# Patient Record
Sex: Male | Born: 1955
Health system: Southern US, Community
[De-identification: ages and names within clinical notes are randomized; demographics above are authoritative.]

## PROBLEM LIST (undated history)

## (undated) ENCOUNTER — Ambulatory Visit: Admission: EM | Disposition: A | Discharge status: Home / Self Care | Payer: BC Managed Care – PPO

## (undated) DIAGNOSIS — K56609 Unspecified intestinal obstruction, unspecified as to partial versus complete obstruction: Secondary | ICD-10-CM

## (undated) DIAGNOSIS — C801 Malignant (primary) neoplasm, unspecified: Secondary | ICD-10-CM

---

## 1980-02-14 DIAGNOSIS — C801 Malignant (primary) neoplasm, unspecified: Secondary | ICD-10-CM

## 1980-02-14 HISTORY — PX: NEPHRECTOMY: SHX65

## 1980-02-14 HISTORY — DX: Malignant (primary) neoplasm, unspecified: C80.1

## 1980-02-14 HISTORY — PX: SURGERY SCROTAL / TESTICULAR: SUR1316

## 2000-10-18 ENCOUNTER — Encounter: Payer: Self-pay | Admitting: *Deleted

## 2000-10-18 ENCOUNTER — Emergency Department (HOSPITAL_COMMUNITY): Admission: EM | Admit: 2000-10-18 | Discharge: 2000-10-18 | Payer: Self-pay | Admitting: Emergency Medicine

## 2004-09-28 ENCOUNTER — Encounter: Admission: RE | Admit: 2004-09-28 | Discharge: 2004-09-28 | Payer: Self-pay | Admitting: Family Medicine

## 2004-09-28 ENCOUNTER — Ambulatory Visit: Payer: Self-pay | Admitting: Family Medicine

## 2004-12-27 ENCOUNTER — Ambulatory Visit: Payer: Self-pay | Admitting: Family Medicine

## 2005-10-25 LAB — HM COLONOSCOPY: HM Colonoscopy: NORMAL

## 2006-01-30 ENCOUNTER — Ambulatory Visit: Payer: Self-pay | Admitting: Family Medicine

## 2006-04-24 ENCOUNTER — Ambulatory Visit: Payer: Self-pay | Admitting: Family Medicine

## 2006-04-24 LAB — CONVERTED CEMR LAB
ALT: 45 units/L — ABNORMAL HIGH (ref 0–40)
AST: 34 units/L (ref 0–37)
Albumin: 4 g/dL (ref 3.5–5.2)
Alkaline Phosphatase: 55 units/L (ref 39–117)
BUN: 20 mg/dL (ref 6–23)
Basophils Absolute: 0 10*3/uL (ref 0.0–0.1)
Basophils Relative: 0.1 % (ref 0.0–1.0)
Bilirubin, Direct: 0.2 mg/dL (ref 0.0–0.3)
CO2: 33 meq/L — ABNORMAL HIGH (ref 19–32)
Calcium: 9.3 mg/dL (ref 8.4–10.5)
Chloride: 104 meq/L (ref 96–112)
Cholesterol: 141 mg/dL (ref 0–200)
Creatinine, Ser: 1 mg/dL (ref 0.4–1.5)
Eosinophils Absolute: 0.1 10*3/uL (ref 0.0–0.6)
Eosinophils Relative: 2.3 % (ref 0.0–5.0)
GFR calc Af Amer: 102 mL/min
GFR calc non Af Amer: 84 mL/min
Glucose, Bld: 80 mg/dL (ref 70–99)
HCT: 45 % (ref 39.0–52.0)
HDL: 38.4 mg/dL — ABNORMAL LOW (ref >39.0)
Hemoglobin: 15.9 g/dL (ref 13.0–17.0)
LDL Cholesterol: 85 mg/dL (ref 0–99)
Lymphocytes Relative: 29.8 % (ref 12.0–46.0)
MCHC: 35.3 g/dL (ref 30.0–36.0)
MCV: 91.4 fL (ref 78.0–100.0)
Monocytes Absolute: 0.5 10*3/uL (ref 0.2–0.7)
Monocytes Relative: 11.6 % — ABNORMAL HIGH (ref 3.0–11.0)
Neutro Abs: 2.3 10*3/uL (ref 1.4–7.7)
Neutrophils Relative %: 56.2 % (ref 43.0–77.0)
PSA: 0.21 ng/mL (ref 0.10–4.00)
Platelets: 209 10*3/uL (ref 150–400)
Potassium: 3.5 meq/L (ref 3.5–5.1)
RBC: 4.92 M/uL (ref 4.22–5.81)
RDW: 11.6 % (ref 11.5–14.6)
Sodium: 147 meq/L — ABNORMAL HIGH (ref 135–145)
TSH: 0.89 microintl units/mL (ref 0.35–5.50)
Total Bilirubin: 1 mg/dL (ref 0.3–1.2)
Total CHOL/HDL Ratio: 3.7
Total Protein: 6.5 g/dL (ref 6.0–8.3)
Triglycerides: 89 mg/dL (ref 0–149)
VLDL: 18 mg/dL (ref 0–40)
WBC: 4.1 10*3/uL — ABNORMAL LOW (ref 4.5–10.5)

## 2006-05-03 ENCOUNTER — Ambulatory Visit: Payer: Self-pay | Admitting: Family Medicine

## 2006-05-16 ENCOUNTER — Ambulatory Visit: Payer: Self-pay | Admitting: Gastroenterology

## 2006-05-30 ENCOUNTER — Encounter (INDEPENDENT_AMBULATORY_CARE_PROVIDER_SITE_OTHER): Payer: Self-pay | Admitting: Specialist

## 2006-05-30 ENCOUNTER — Ambulatory Visit: Payer: Self-pay | Admitting: Gastroenterology

## 2006-09-11 ENCOUNTER — Encounter: Payer: Self-pay | Admitting: Family Medicine

## 2006-09-12 ENCOUNTER — Ambulatory Visit: Payer: Self-pay | Admitting: Family Medicine

## 2006-09-12 DIAGNOSIS — M542 Cervicalgia: Secondary | ICD-10-CM | POA: Insufficient documentation

## 2006-09-12 DIAGNOSIS — R300 Dysuria: Secondary | ICD-10-CM | POA: Insufficient documentation

## 2006-09-12 LAB — CONVERTED CEMR LAB
Bilirubin Urine: NEGATIVE
Blood in Urine, dipstick: NEGATIVE
Glucose, Urine, Semiquant: NEGATIVE
Ketones, urine, test strip: NEGATIVE
Nitrite: NEGATIVE
Protein, U semiquant: NEGATIVE
Specific Gravity, Urine: 1.02
Urobilinogen, UA: 0.2
WBC Urine, dipstick: NEGATIVE
pH: 5

## 2006-12-20 ENCOUNTER — Ambulatory Visit: Payer: Self-pay | Admitting: Family Medicine

## 2006-12-20 DIAGNOSIS — E785 Hyperlipidemia, unspecified: Secondary | ICD-10-CM | POA: Insufficient documentation

## 2006-12-20 DIAGNOSIS — M79609 Pain in unspecified limb: Secondary | ICD-10-CM | POA: Insufficient documentation

## 2006-12-20 DIAGNOSIS — Z8547 Personal history of malignant neoplasm of testis: Secondary | ICD-10-CM | POA: Insufficient documentation

## 2006-12-20 LAB — CONVERTED CEMR LAB
Bilirubin Urine: NEGATIVE
Blood in Urine, dipstick: NEGATIVE
Glucose, Urine, Semiquant: NEGATIVE
Ketones, urine, test strip: NEGATIVE
Nitrite: NEGATIVE
Protein, U semiquant: NEGATIVE
Specific Gravity, Urine: 1.015
Urobilinogen, UA: 0.2
WBC Urine, dipstick: NEGATIVE
pH: 5.5

## 2007-01-21 ENCOUNTER — Telehealth: Payer: Self-pay | Admitting: Family Medicine

## 2007-10-09 ENCOUNTER — Ambulatory Visit: Payer: Self-pay | Admitting: Family Medicine

## 2007-10-09 DIAGNOSIS — K625 Hemorrhage of anus and rectum: Secondary | ICD-10-CM | POA: Insufficient documentation

## 2007-10-11 LAB — CONVERTED CEMR LAB
ALT: 41 units/L (ref 0–53)
AST: 27 units/L (ref 0–37)
Albumin: 4.2 g/dL (ref 3.5–5.2)
Alkaline Phosphatase: 61 units/L (ref 39–117)
BUN: 18 mg/dL (ref 6–23)
Basophils Absolute: 0 10*3/uL (ref 0.0–0.1)
Basophils Relative: 0.3 % (ref 0.0–3.0)
Bilirubin, Direct: 0.1 mg/dL (ref 0.0–0.3)
CO2: 29 meq/L (ref 19–32)
Calcium: 9.5 mg/dL (ref 8.4–10.5)
Chloride: 110 meq/L (ref 96–112)
Cholesterol: 254 mg/dL (ref 0–200)
Creatinine, Ser: 1 mg/dL (ref 0.4–1.5)
Direct LDL: 138.2 mg/dL
Eosinophils Absolute: 0.1 10*3/uL (ref 0.0–0.7)
Eosinophils Relative: 1.7 % (ref 0.0–5.0)
GFR calc Af Amer: 101 mL/min
GFR calc non Af Amer: 83 mL/min
Glucose, Bld: 90 mg/dL (ref 70–99)
HCT: 47.9 % (ref 39.0–52.0)
HDL: 43.7 mg/dL (ref >39.0)
Hemoglobin: 17.1 g/dL — ABNORMAL HIGH (ref 13.0–17.0)
Lymphocytes Relative: 26.7 % (ref 12.0–46.0)
MCHC: 35.7 g/dL (ref 30.0–36.0)
MCV: 92.2 fL (ref 78.0–100.0)
Monocytes Absolute: 0.5 10*3/uL (ref 0.1–1.0)
Monocytes Relative: 11.1 % (ref 3.0–12.0)
Neutro Abs: 3 10*3/uL (ref 1.4–7.7)
Neutrophils Relative %: 60.2 % (ref 43.0–77.0)
PSA: 0.2 ng/mL (ref 0.10–4.00)
Platelets: 210 10*3/uL (ref 150–400)
Potassium: 3.6 meq/L (ref 3.5–5.1)
RBC: 5.2 M/uL (ref 4.22–5.81)
RDW: 11.5 % (ref 11.5–14.6)
Sodium: 143 meq/L (ref 135–145)
TSH: 0.64 microintl units/mL (ref 0.35–5.50)
Total Bilirubin: 1 mg/dL (ref 0.3–1.2)
Total CHOL/HDL Ratio: 5.8
Total Protein: 7.4 g/dL (ref 6.0–8.3)
Triglycerides: 118 mg/dL (ref 0–149)
VLDL: 24 mg/dL (ref 0–40)
WBC: 4.9 10*3/uL (ref 4.5–10.5)

## 2007-12-25 ENCOUNTER — Telehealth: Payer: Self-pay | Admitting: Family Medicine

## 2008-09-01 ENCOUNTER — Ambulatory Visit: Payer: Self-pay | Admitting: Family Medicine

## 2008-09-09 ENCOUNTER — Ambulatory Visit: Payer: Self-pay | Admitting: Family Medicine

## 2008-09-16 LAB — CONVERTED CEMR LAB
ALT: 38 units/L (ref 0–53)
AST: 29 units/L (ref 0–37)
Albumin: 4 g/dL (ref 3.5–5.2)
Alkaline Phosphatase: 56 units/L (ref 39–117)
BUN: 21 mg/dL (ref 6–23)
Basophils Absolute: 0 10*3/uL (ref 0.0–0.1)
Basophils Relative: 0.2 % (ref 0.0–3.0)
Bilirubin Urine: NEGATIVE
Bilirubin, Direct: 0.1 mg/dL (ref 0.0–0.3)
Blood in Urine, dipstick: NEGATIVE
CO2: 30 meq/L (ref 19–32)
Calcium: 9.4 mg/dL (ref 8.4–10.5)
Chloride: 109 meq/L (ref 96–112)
Cholesterol: 157 mg/dL (ref 0–200)
Creatinine, Ser: 1 mg/dL (ref 0.4–1.5)
Eosinophils Absolute: 0.1 10*3/uL (ref 0.0–0.7)
Eosinophils Relative: 2 % (ref 0.0–5.0)
GFR calc non Af Amer: 83.04 mL/min (ref >60)
Glucose, Bld: 83 mg/dL (ref 70–99)
Glucose, Urine, Semiquant: NEGATIVE
HCT: 45.1 % (ref 39.0–52.0)
HDL: 41.4 mg/dL (ref >39.00)
Hemoglobin: 15.7 g/dL (ref 13.0–17.0)
LDL Cholesterol: 98 mg/dL (ref 0–99)
Lymphocytes Relative: 32.4 % (ref 12.0–46.0)
Lymphs Abs: 1.5 10*3/uL (ref 0.7–4.0)
MCHC: 34.9 g/dL (ref 30.0–36.0)
MCV: 93.4 fL (ref 78.0–100.0)
Monocytes Absolute: 0.5 10*3/uL (ref 0.1–1.0)
Monocytes Relative: 12 % (ref 3.0–12.0)
Neutro Abs: 2.4 10*3/uL (ref 1.4–7.7)
Neutrophils Relative %: 53.4 % (ref 43.0–77.0)
Nitrite: NEGATIVE
PSA: 0.25 ng/mL (ref 0.10–4.00)
Platelets: 185 10*3/uL (ref 150.0–400.0)
Potassium: 3.7 meq/L (ref 3.5–5.1)
RBC: 4.83 M/uL (ref 4.22–5.81)
RDW: 11.3 % — ABNORMAL LOW (ref 11.5–14.6)
Sodium: 144 meq/L (ref 135–145)
Specific Gravity, Urine: 1.02
TSH: 0.66 microintl units/mL (ref 0.35–5.50)
Total Bilirubin: 1.1 mg/dL (ref 0.3–1.2)
Total CHOL/HDL Ratio: 4
Total Protein: 6.9 g/dL (ref 6.0–8.3)
Triglycerides: 88 mg/dL (ref 0.0–149.0)
Urobilinogen, UA: 1
VLDL: 17.6 mg/dL (ref 0.0–40.0)
WBC Urine, dipstick: NEGATIVE
WBC: 4.5 10*3/uL (ref 4.5–10.5)
pH: 6.5

## 2008-09-17 ENCOUNTER — Telehealth: Payer: Self-pay | Admitting: Family Medicine

## 2009-10-11 ENCOUNTER — Telehealth: Payer: Self-pay | Admitting: Family Medicine

## 2009-10-21 ENCOUNTER — Ambulatory Visit: Payer: Self-pay | Admitting: Family Medicine

## 2009-10-21 LAB — CONVERTED CEMR LAB
Bilirubin Urine: NEGATIVE
Blood in Urine, dipstick: NEGATIVE
Glucose, Urine, Semiquant: NEGATIVE
Ketones, urine, test strip: NEGATIVE
Nitrite: NEGATIVE
Protein, U semiquant: NEGATIVE
Specific Gravity, Urine: <1.005
Urobilinogen, UA: 0.2
WBC Urine, dipstick: NEGATIVE
pH: 5.5

## 2009-10-22 LAB — CONVERTED CEMR LAB
ALT: 39 units/L (ref 0–53)
AST: 29 units/L (ref 0–37)
Albumin: 4.1 g/dL (ref 3.5–5.2)
Alkaline Phosphatase: 66 units/L (ref 39–117)
BUN: 21 mg/dL (ref 6–23)
Basophils Absolute: 0 10*3/uL (ref 0.0–0.1)
Basophils Relative: 0.2 % (ref 0.0–3.0)
Bilirubin, Direct: 0.2 mg/dL (ref 0.0–0.3)
CO2: 29 meq/L (ref 19–32)
Calcium: 9.6 mg/dL (ref 8.4–10.5)
Chloride: 103 meq/L (ref 96–112)
Cholesterol: 190 mg/dL (ref 0–200)
Creatinine, Ser: 1 mg/dL (ref 0.4–1.5)
Eosinophils Absolute: 0.1 10*3/uL (ref 0.0–0.7)
Eosinophils Relative: 1.5 % (ref 0.0–5.0)
GFR calc non Af Amer: 79.91 mL/min (ref >60)
Glucose, Bld: 76 mg/dL (ref 70–99)
HCT: 46 % (ref 39.0–52.0)
HDL: 39.2 mg/dL (ref >39.00)
Hemoglobin: 16.1 g/dL (ref 13.0–17.0)
LDL Cholesterol: 121 mg/dL — ABNORMAL HIGH (ref 0–99)
Lymphocytes Relative: 28.8 % (ref 12.0–46.0)
Lymphs Abs: 1.5 10*3/uL (ref 0.7–4.0)
MCHC: 34.9 g/dL (ref 30.0–36.0)
MCV: 95 fL (ref 78.0–100.0)
Monocytes Absolute: 0.7 10*3/uL (ref 0.1–1.0)
Monocytes Relative: 13.3 % — ABNORMAL HIGH (ref 3.0–12.0)
Neutro Abs: 3 10*3/uL (ref 1.4–7.7)
Neutrophils Relative %: 56.2 % (ref 43.0–77.0)
PSA: 0.19 ng/mL (ref 0.10–4.00)
Platelets: 201 10*3/uL (ref 150.0–400.0)
Potassium: 3.8 meq/L (ref 3.5–5.1)
RBC: 4.84 M/uL (ref 4.22–5.81)
RDW: 12.6 % (ref 11.5–14.6)
Sodium: 140 meq/L (ref 135–145)
TSH: 0.96 microintl units/mL (ref 0.35–5.50)
Total Bilirubin: 1 mg/dL (ref 0.3–1.2)
Total CHOL/HDL Ratio: 5
Total Protein: 6.6 g/dL (ref 6.0–8.3)
Triglycerides: 148 mg/dL (ref 0.0–149.0)
VLDL: 29.6 mg/dL (ref 0.0–40.0)
WBC: 5.3 10*3/uL (ref 4.5–10.5)

## 2009-10-28 ENCOUNTER — Ambulatory Visit: Payer: Self-pay | Admitting: Family Medicine

## 2009-10-28 DIAGNOSIS — J3489 Other specified disorders of nose and nasal sinuses: Secondary | ICD-10-CM | POA: Insufficient documentation

## 2009-10-28 DIAGNOSIS — H6120 Impacted cerumen, unspecified ear: Secondary | ICD-10-CM | POA: Insufficient documentation

## 2009-10-28 DIAGNOSIS — D485 Neoplasm of uncertain behavior of skin: Secondary | ICD-10-CM | POA: Insufficient documentation

## 2009-11-18 ENCOUNTER — Encounter: Payer: Self-pay | Admitting: Family Medicine

## 2010-03-17 NOTE — Consult Note (Signed)
Summary: [facility]  [facility]   Imported By: Maryln Gottron 11/26/2009 10:56:43  _____________________________________________________________________  External Attachment:    Type:   Image     Comment:   External Document

## 2010-03-17 NOTE — Assessment & Plan Note (Signed)
Summary: CPX // RS   Vital Signs:  Patient profile:   55 year old male Height:      73 inches (185.42 cm) Weight:      200.31 pounds (91.05 kg) BMI:     26.52 O2 Sat:      97 % on Room air Temp:     98.2 degrees F (36.78 degrees C) oral Pulse rate:   78 / minute BP sitting:   118 / 78  (left arm) Cuff size:   regular  Vitals Entered By: Josph Macho RMA (October 28, 2009 8:48 AM)  O2 Flow:  Room air CC: Physical/ CF Is Patient Diabetic? No   History of Present Illness: Patient in today for annual exam. Generally doing well but with a few concerns. He has been watching a lesion on his scalp for several months now. He describes it as scaly with an erythematous base. The scales fall off at times and then return. They came off just yesterday. No bleeding and it is not enlarging, denies any trauma as an initiating event. Otherwise he feels well. He does note some occasional pain in his left hip and his shoulders R>L. Throbs at times especially when he lies on it. He is left handed. He denies CP/palp/SOB/f/c/recent illness/GI or GU c/o.  Preventive Screening-Counseling & Management  Alcohol-Tobacco     Smoking Status: never  Current Medications (verified): 1)  Diclofenac Sodium 75 Mg  Tbec (Diclofenac Sodium) .Marland Kitchen.. 1 Two Times A Day After Meals For Arthritis 2)  Simvastatin 40 Mg  Tabs (Simvastatin) .Marland Kitchen.. 1 Tab Hs For Lipid Control 3)  Adult Aspirin Ec Low Strength 81 Mg Tbec (Aspirin) .Marland Kitchen.. 1 Qd  Allergies (verified): No Known Drug Allergies  Past History:  Past medical, surgical, family and social histories (including risk factors) reviewed, and no changes noted (except as noted below).  Past Surgical History: Reviewed history from 09/11/2006 and no changes required. Left Testiculr and Left Kidney Cancer  Family History: Reviewed history and no changes required. Father alive at 19 with atrial fibrillation, h/o cigarette smoking No known FH of skin cancer  Social  History: Reviewed history and no changes required. Married Never Smoked Seat belt use is constant Alcohol use-yes, very occasional use  Review of Systems  The patient denies anorexia, fever, weight loss, weight gain, vision loss, decreased hearing, hoarseness, chest pain, syncope, dyspnea on exertion, peripheral edema, prolonged cough, headaches, hemoptysis, abdominal pain, melena, hematochezia, severe indigestion/heartburn, hematuria, incontinence, genital sores, muscle weakness, suspicious skin lesions, transient blindness, difficulty walking, depression, unusual weight change, abnormal bleeding, enlarged lymph nodes, angioedema, breast masses, and testicular masses.    Physical Exam  General:  Well-developed,well-nourished,in no acute distress; alert,appropriate and cooperative throughout examination Head:  Normocephalic and atraumatic without obvious abnormalities. No apparent alopecia or balding. Eyes:  No corneal or conjunctival inflammation noted. EOMI.  Ears:  Cerumen is occluding both ear canals Nose:  Deviated septum noted with very narrow passage on right side. Mucosa is boggy and mildly erythematous Mouth:  Oral mucosa and oropharynx without lesions or exudates.  Teeth in good repair. Neck:  No deformities, masses, or tenderness noted. Lungs:  Normal respiratory effort, chest expands symmetrically. Lungs are clear to auscultation, no crackles or wheezes. Heart:  Normal rate and regular rhythm. S1 and S2 normal without gallop, murmur, click, rub or other extra sounds. Abdomen:  Bowel sounds positive,abdomen soft and non-tender without masses, organomegaly or hernias noted. Msk:  No deformity or scoliosis noted of thoracic or  lumbar spine.   Pulses:  R and L carotid,radial,femoral,dorsalis pedis and posterior tibial pulses are full and equal bilaterally Extremities:  No clubbing, cyanosis, edema, or deformity noted with normal full range of motion of all joints.   Neurologic:  No  cranial nerve deficits noted. Station and gait are normal. Plantar reflexes are down-going bilaterally. DTRs are symmetrical throughout. Sensory, motor and coordinative functions appear intact. Skin:  Intact without suspicious lesions or rashes, except for a 5-10 mm raised, erythematous, slightly scaly lesion on scalp just above left side of forehead Cervical Nodes:  No lymphadenopathy noted Psych:  Cognition and judgment appear intact. Alert and cooperative with normal attention span and concentration. No apparent delusions, illusions, hallucinations   Impression & Recommendations:  Problem # 1:  HYPERLIPIDEMIA (ICD-272.4)  His updated medication list for this problem includes:    Simvastatin 40 Mg Tabs (Simvastatin) .Marland Kitchen... 1 tab hs for lipid control Encouraged fish oil and fiber supplements, avoid trans fats  Problem # 2:  LESION, SCALP (ICD-238.2)  Referred to dermatology for evaluation of skin lesion and mole mapping  Orders: Dermatology Referral (Derma)  Problem # 3:  OTHER DISEASES OF NASAL CAVITY AND SINUSES (ICD-478.19) Encouraged at bedtime nasal saline flushes and offered a referral to ENT for further eval. Declines at this time  Problem # 4:  NECK PAIN (ICD-723.1)  The following medications were removed from the medication list:    Diclofenac Sodium 75 Mg Tbec (Diclofenac sodium) .Marland Kitchen... 1 two times a day after meals for arthritis His updated medication list for this problem includes:    Adult Aspirin Ec Low Strength 81 Mg Tbec (Aspirin) .Marland Kitchen... 1 qd    Meloxicam 15 Mg Tabs (Meloxicam) .Marland Kitchen... 1 tab by mouth daily as needed pain with food Encouraged daily moist heat and gentle stretching to neck and shoulders  Problem # 5:  RECTAL BLEEDING (ICD-569.3) No episodes recently. Will report concerns  Complete Medication List: 1)  Simvastatin 40 Mg Tabs (Simvastatin) .Marland Kitchen.. 1 tab hs for lipid control 2)  Adult Aspirin Ec Low Strength 81 Mg Tbec (Aspirin) .Marland Kitchen.. 1 qd 3)  Meloxicam 15  Mg Tabs (Meloxicam) .Marland Kitchen.. 1 tab by mouth daily as needed pain with food  Patient Instructions: 1)  Please schedule a follow-up appointment in 1 year.  2)  BMP prior to visit, ICD-9: v70.0 3)  Hepatic Panel prior to visit ICD-9: v70.0 4)  Lipid panel prior to visit ICD-9 : 272.4 5)  TSH prior to visit ICD-9 : v70.0 6)  CBC w/ Diff prior to visit ICD-9 : v70.0 7)  PSA prior to visit ICD-9: v70.0 8)  Flush ears with 1/2 hydrogen peroxide and 1/2 warm water if unable to remove cerumen schedule appt for flushing. 9)  You have been referred to Derm to have scalp lesion evaluated 10)  Add 2 fish oil caps to daily regimen to help cholesterol and joints 11)  Moist heat and gentle stretching to neck and shoulders nightly 12)  Consider a fiber supplment such as Benefiber or Metamucil daily to help bowel health and cholesterol Prescriptions: SIMVASTATIN 40 MG  TABS (SIMVASTATIN) 1 tab hs for lipid control  #90 x 3   Entered and Authorized by:   Danise Edge MD   Signed by:   Danise Edge MD on 10/28/2009   Method used:   Electronically to        [facility] (retail)       [address] [place]  [place], [place]  [address]       Ph: [phone]       Fax: [phone]   RxID:   [id number] MELOXICAM 15 MG TABS (MELOXICAM) 1 tab by mouth daily as needed pain with food  #30 x 2   Entered and Authorized by:   Danise Edge MD   Signed by:   Danise Edge MD on 10/28/2009   Method used:   Electronically to        [facility] (retail)       [address] [place], [place]  [address]       Ph: [phone]       Fax: [phone]   RxID:   [id number]

## 2010-03-17 NOTE — Progress Notes (Signed)
Summary: Pt req  refill of Simvastatin 40mg  genic to [facility]  Phone Note Refill Request Call back at [phone] cell Message from:  Patient  Refills Requested: Medication #1:  SIMVASTATIN 40 MG  TABS 1 tab hs for lipid control   Dosage confirmed as above?Dosage Confirmed   Supply Requested: 3 months Please call in 90 day supply to [facility].       Method Requested: Telephone to Pharmacy Initial call taken by: Lucy Antigua,  October 11, 2009 3:40 PM  Follow-up for Phone Call        OK to refill his Simvastatin 40mg  1 tab by mouth at bedtime #90 with 0 RF, but he has not done labs in nearly 14 months now, needs to repeat FLP and LFT before more 90 day supply refills will be called in Follow-up by: Danise Edge MD,  October 11, 2009 5:06 PM  Additional Follow-up for Phone Call Additional follow up Details #1::        Informed pt Additional Follow-up by: Josph Macho RMA,  October 12, 2009 7:53 AM    Prescriptions: SIMVASTATIN 40 MG  TABS (SIMVASTATIN) 1 tab hs for lipid control  #90 x 0   Entered by:   Josph Macho RMA   Authorized by:   Danise Edge MD   Signed by:   Josph Macho RMA on 10/12/2009   Method used:   Electronically to        [facility] (retail)       [address] [place], [place]  [address]       Ph: [phone]       Fax: [phone]   RxID:   [id number]

## 2011-03-06 ENCOUNTER — Encounter (HOSPITAL_COMMUNITY): Payer: Self-pay | Admitting: Family Medicine

## 2011-03-06 ENCOUNTER — Emergency Department (HOSPITAL_COMMUNITY): Payer: BC Managed Care – PPO

## 2011-03-06 ENCOUNTER — Inpatient Hospital Stay (HOSPITAL_COMMUNITY)
Admission: EM | Admit: 2011-03-06 | Discharge: 2011-03-09 | DRG: 180 | Disposition: A | Discharge status: Home / Self Care | Payer: BC Managed Care – PPO | Attending: Internal Medicine | Admitting: Internal Medicine

## 2011-03-06 DIAGNOSIS — R1115 Cyclical vomiting syndrome unrelated to migraine: Secondary | ICD-10-CM

## 2011-03-06 DIAGNOSIS — Z8547 Personal history of malignant neoplasm of testis: Secondary | ICD-10-CM

## 2011-03-06 DIAGNOSIS — K56609 Unspecified intestinal obstruction, unspecified as to partial versus complete obstruction: Principal | ICD-10-CM | POA: Diagnosis present

## 2011-03-06 DIAGNOSIS — E876 Hypokalemia: Secondary | ICD-10-CM | POA: Diagnosis present

## 2011-03-06 DIAGNOSIS — R112 Nausea with vomiting, unspecified: Secondary | ICD-10-CM | POA: Diagnosis present

## 2011-03-06 HISTORY — DX: Malignant (primary) neoplasm, unspecified: C80.1

## 2011-03-06 HISTORY — DX: Unspecified intestinal obstruction, unspecified as to partial versus complete obstruction: K56.609

## 2011-03-06 LAB — COMPREHENSIVE METABOLIC PANEL
ALT: 23 U/L (ref 0–53)
Albumin: 4.3 g/dL (ref 3.5–5.2)
Alkaline Phosphatase: 74 U/L (ref 39–117)
BUN: 17 mg/dL (ref 6–23)
Chloride: 100 mEq/L (ref 96–112)
Glucose, Bld: 105 mg/dL — ABNORMAL HIGH (ref 70–99)
Potassium: 3.6 mEq/L (ref 3.5–5.1)
Sodium: 139 mEq/L (ref 135–145)
Total Bilirubin: 0.5 mg/dL (ref 0.3–1.2)

## 2011-03-06 LAB — CBC
HCT: 45.4 % (ref 39.0–52.0)
Hemoglobin: 16.3 g/dL (ref 13.0–17.0)
RBC: 5.13 MIL/uL (ref 4.22–5.81)
WBC: 9.1 10*3/uL (ref 4.0–10.5)

## 2011-03-06 LAB — DIFFERENTIAL
Lymphocytes Relative: 10 % — ABNORMAL LOW (ref 12–46)
Monocytes Absolute: 0.7 10*3/uL (ref 0.1–1.0)
Monocytes Relative: 7 % (ref 3–12)
Neutro Abs: 7.5 10*3/uL (ref 1.7–7.7)
Neutrophils Relative %: 83 % — ABNORMAL HIGH (ref 43–77)

## 2011-03-06 MED ORDER — SODIUM CHLORIDE 0.9 % IV SOLN
Freq: Once | INTRAVENOUS | Status: AC
  Administered 2011-03-06: 12:00:00 via INTRAVENOUS

## 2011-03-06 MED ORDER — MORPHINE SULFATE 4 MG/ML IJ SOLN: 8.0000 mg | INTRAMUSCULAR | Status: DC | PRN

## 2011-03-06 MED ORDER — METOCLOPRAMIDE HCL 5 MG/ML IJ SOLN
10.0000 mg | Freq: Once | INTRAMUSCULAR | Status: AC
  Administered 2011-03-06: 10 mg via INTRAVENOUS

## 2011-03-06 MED ORDER — ONDANSETRON HCL 4 MG/2ML IJ SOLN
4.0000 mg | INTRAMUSCULAR | Status: AC | PRN
  Administered 2011-03-06 (×2): 4 mg via INTRAVENOUS
  Filled 2011-03-06 (×2): qty 2

## 2011-03-06 MED ORDER — IOHEXOL 300 MG/ML  SOLN
100.0000 mL | Freq: Once | INTRAMUSCULAR | Status: AC | PRN
  Administered 2011-03-06: 100 mL via INTRAVENOUS

## 2011-03-06 MED ORDER — ONDANSETRON HCL 4 MG/2ML IJ SOLN
4.0000 mg | INTRAMUSCULAR | Status: DC | PRN
  Administered 2011-03-06: 4 mg via INTRAVENOUS
  Filled 2011-03-06: qty 2

## 2011-03-06 MED ORDER — MORPHINE SULFATE 4 MG/ML IJ SOLN
8.0000 mg | INTRAMUSCULAR | Status: AC | PRN
  Administered 2011-03-06 (×2): 8 mg via INTRAVENOUS
  Filled 2011-03-06 (×2): qty 2

## 2011-03-06 NOTE — ED Notes (Signed)
Per EMS: Pt reports having "belly pain" since last night. Denies N/V/D.

## 2011-03-06 NOTE — ED Notes (Signed)
WUJ:[id number]<YN> Expected date:03/06/11<BR> Expected time:10:30 AM<BR> Means of arrival:Ambulance<BR> Comments:<BR> M60. 56 yo m. abd pain. 15 min eta

## 2011-03-06 NOTE — ED Provider Notes (Signed)
History     CSN: [id number]  Arrival date & time 03/06/11  1032   First MD Initiated Contact with Patient 03/06/11 1103      Chief Complaint  Patient presents with  . Abdominal Pain    (Consider location/radiation/quality/duration/timing/severity/associated sxs/prior treatment) HPI Comments: Patient presents with a chief complaint of abdominal pain since last night.  The pain at that time was ranked at a 3/10.  When the patient was woke up this morning his pain was excruciating at 9/10.  He feels as if his pain is gradually worsening and comes in waves intermittently.  His pain currently is 6/10 and located in the ED.Pain located in the periumbilical & epigastric areas.  The patient does have a history of abdominal surgery, testicular cancer that spread to his kidney, nephrectomy and small bowel obstruction.  Patient denies vomiting, diarrhea, constipation, fever, night sweats, chills, change in stool color, or urinary symptoms including frequency, hesitancy, dysuria.  Patient denies back pain.  Patient is a 56 y.o. male presenting with abdominal pain. The history is provided by the patient.  Abdominal Pain The primary symptoms of the illness include abdominal pain and nausea. The primary symptoms of the illness do not include fever, shortness of breath, vomiting, diarrhea or dysuria. The current episode started yesterday. The onset of the illness was gradual. The problem has been gradually worsening.  Symptoms associated with the illness do not include chills, constipation, urgency or hematuria.    Past Medical History  Diagnosis Date  . Cancer     testicular  . Small bowel obstruction     History reviewed. No pertinent past surgical history.  History reviewed. No pertinent family history.  History  Substance Use Topics  . Smoking status: Never Smoker   . Smokeless tobacco: Not on file  . Alcohol Use: No     former use x20 years ago      Review of Systems    Constitutional: Positive for appetite change. Negative for fever and chills.  HENT: Negative for neck stiffness and dental problem.   Eyes: Negative for visual disturbance.  Respiratory: Negative for cough, chest tightness, shortness of breath and wheezing.   Cardiovascular: Negative for chest pain.  Gastrointestinal: Positive for nausea and abdominal pain. Negative for vomiting, diarrhea, constipation, blood in stool, abdominal distention, anal bleeding and rectal pain.  Genitourinary: Negative for dysuria, urgency, hematuria, flank pain, discharge, scrotal swelling, penile pain and testicular pain.  Musculoskeletal: Negative for myalgias and arthralgias.  Skin: Negative for rash.  Neurological: Negative for dizziness, syncope, speech difficulty, numbness and headaches.  Hematological: Does not bruise/bleed easily.  All other systems reviewed and are negative.    Allergies  Review of patient's allergies indicates no known allergies.  Home Medications   Current Outpatient Rx  Name Route Sig Dispense Refill  . BISMUTH SUBSALICYLATE 262 MG/15ML PO SUSP Oral Take 15 mLs by mouth every 6 (six) hours as needed. For stomach pain    . SIMVASTATIN 40 MG PO TABS Oral Take 40 mg by mouth every evening.    Marland Kitchen VITAMIN C 500 MG PO TABS Oral Take 1,000 mg by mouth daily.      BP 128/72  Pulse 69  Temp(Src) 97.8 F (36.6 C) (Oral)  Resp 18  SpO2 94%  Physical Exam  Nursing note and vitals reviewed. Constitutional: Vital signs are normal. He appears well-developed and well-nourished. No distress.  HENT:  Head: Normocephalic and atraumatic.  Mouth/Throat: Uvula is midline, oropharynx is clear and  moist and mucous membranes are normal.  Eyes: Conjunctivae and EOM are normal. Pupils are equal, round, and reactive to light.  Neck: Normal range of motion and full passive range of motion without pain. Neck supple. No spinous process tenderness and no muscular tenderness present. No rigidity. No  Brudzinski's sign noted.  Cardiovascular: Normal rate and regular rhythm.   Pulmonary/Chest: Effort normal and breath sounds normal. No accessory muscle usage. Not tachypneic. No respiratory distress.  Abdominal: Soft. Normal appearance. He exhibits no distension, no ascites, no pulsatile midline mass and no mass. There is tenderness in the epigastric area and periumbilical area. There is no CVA tenderness. No hernia.    Lymphadenopathy:    He has no cervical adenopathy.  Neurological: He is alert.  Skin: Skin is warm and dry. No rash noted. He is not diaphoretic.  Psychiatric: He has a normal mood and affect. His speech is normal and behavior is normal.    ED Course  Procedures (including critical care time)  Labs Reviewed  DIFFERENTIAL - Abnormal; Notable for the following:    Neutrophils Relative 83 (*)    Lymphocytes Relative 10 (*)    All other components within normal limits  COMPREHENSIVE METABOLIC PANEL - Abnormal; Notable for the following:    Glucose, Bld 105 (*)    All other components within normal limits  CBC  LIPASE, BLOOD   Ct Abdomen Pelvis W Contrast  03/06/2011  *RADIOLOGY REPORT*  Clinical Data: Mid abdominal pain, history of small bowel obstruction, testicular cancer with metastatic disease, history of nephrectomy.  CT ABDOMEN AND PELVIS WITH CONTRAST  Technique:  Multidetector CT imaging of the abdomen and pelvis was performed following the standard protocol during bolus administration of intravenous contrast.  Contrast: OMNIPAQUE IOHEXOL 300 MG/ML IV SOLN  Comparison: None.  Findings:  Normal hepatic contour.  No discrete hepatic lesions.  Normal gallbladder.  No intra or extrahepatic biliary duct dilatation.  A small amount of fluid is noted adjacent to the inferior aspect of the right lobe of the liver (image 32, series 2).  Post left-sided nephrectomy.  There is no residual tissue within the left nephrectomy bed.  The remaining right kidney enhances and  excretes normally.  No discrete right-sided renal lesions.  No urinary obstruction.  The bilateral adrenal glands are normal. Normal pancreas and spleen.  Incidental note is made of several small splenules.  The majority of ingested enteric contrast remains within the stomach.  There is mild dilatation of several loops of small bowel within the lower abdomen ( index loop measures 2.8 cm in diameter, image 72, series 2), however there is no definitive upstream and downstream dilatation to suggest obstruction.  Several of these dilated loops of small bowel contain mottled lucencies favored to represent stool which may suggest an element of stasis.  The appendix is not definitely identified, however there is no stranding within the right lower quadrant.  No pneumoperitoneum, pneumatosis or portal venous gas.  Normal caliber abdominal aorta.  The major branch vessels of the abdominal aorta are all patent.  Post retroperitoneal and bilateral pelvic sidewall lymph node dissection without evidence of retroperitoneal, mesenteric, pelvic or inguinal lymphadenopathy.  Limited visualization of the lower thorax demonstrates bibasilar atelectasis.  No focal airspace opacities.  No pleural effusion. Normal heart size.  No pericardial effusion.  No acute or aggressive osseous abnormalities.  Lumbar spine degenerative change.  IMPRESSION: 1.  Mild dilatation of several loops of small bowel within the mid/lower pelvis without  a discrete transition point to suggest obstruction.  Several of these loops of small bowel contain feces which may suggest a component of enteric stasis.  2.  Post left-sided nephrectomy and extensive retroperitoneal and pelvic lymphadenectomy without evidence of recurrent disease.  Original Report Authenticated By: Waynard Reeds, M.D.   Dg Abd Acute W/chest  03/06/2011  *RADIOLOGY REPORT*  Clinical Data: Epigastric pain with nausea vomiting.  ACUTE ABDOMEN SERIES (ABDOMEN 2 VIEW & CHEST 1 VIEW)   Comparison: None.  Findings: The lungs are clear without focal infiltrate, edema, pneumothorax or pleural effusion. Interstitial markings are diffusely coarsened with chronic features. The cardiopericardial silhouette is within normal limits for size. Imaged bony structures of the thorax are intact.  Upright film shows no evidence for intraperitoneal free air. Scattered small bowel air-fluid levels are seen on the upright film and a a dilated small bowel loop measuring up to 4.8 cm in diameter is seen in the left upper quadrant on the supine film.  There is stool is seen scattered along the course of the colon.  There are numerous surgical clips throughout the abdomen and pelvis.  IMPRESSION: No acute cardiopulmonary findings.  No intraperitoneal free air.  Mildly dilated small bowel loop with the scattered air-fluid levels in the small bowel.  The focal infectious or inflammatory ileus cannot be excluded. Small bowel obstruction is also a consideration.  CT imaging of the abdomen and pelvis may be helpful to further evaluate.  Original Report Authenticated By: ERIC A. MANSELL, M.D.     No diagnosis found.  Since in the ED the pt has had 3 episodes of emesis and is unable to tolerate PO fluids. His pain is being managed well and VSS. CT did not show definitive SBO. Internal medicine paged to admit for observation.   MDM  N/V abd pain, question SBO   InPt, med surg, Admit for observation (emesis, abd pn), Dr. Darnelle Catalan [facility] [address] [place] 03/06/11 [phone]

## 2011-03-06 NOTE — H&P (Signed)
Hospital Admission Note Date: 03/06/2011  Patient name: Calvin Burke Medical record number: [id number] Date of birth: 15-Apr-1955 Age: 56 y.o. Gender: male PCP: STAFFORD JR,WILLIE RANSOME, MD, MD  Medical Service: [facility] 4, Dr.  Darnelle Catalan  Chief Complaint: Nausea vomiting  History of Present Illness: The patient is a 56 year old gentleman who presented to the [facility] long emergency department complaining of acute onset abdominal pain nausea and vomiting which began this morning. He reports 2-3 episodes of intense emesis following his breakfast. He has no unusual food exposures no sick contacts. He denies any fever chills or diarrhea. His abdominal pain is described as crampy and associated with severe nausea. He describes a sense of fullness in his abdomen. He denies any chest pain diaphoresis or shortness of breath. He denies any hemoptysis hematemesis or hematochezia.  Meds: Medications Prior to Admission  Medication Dose Route Frequency Provider Last Rate Last Dose  . 0.9 %  sodium chloride infusion   Intravenous Once Lisette Paz, PA-C      . iohexol (OMNIPAQUE) 300 MG/ML solution 100 mL  100 mL Intravenous Once PRN Medication Radiologist, MD   100 mL at 03/06/11 1706  . metoCLOPramide (REGLAN) injection 10 mg  10 mg Intravenous Once Anderson Malta, DO      . morphine 4 MG/ML injection 8 mg  8 mg Intravenous PRN Lisette Paz, PA-C   8 mg at 03/06/11 1541  . morphine 4 MG/ML injection 8 mg  8 mg Intravenous PRN Lisette Paz, PA-C      . ondansetron (ZOFRAN) injection 4 mg  4 mg Intravenous PRN Lisette Paz, PA-C   4 mg at 03/06/11 1541  . ondansetron (ZOFRAN) injection 4 mg  4 mg Intravenous PRN Lisette Paz, PA-C       No current outpatient prescriptions on file as of 03/06/2011.    Allergies: Review of patient's allergies indicates no known allergies. Past Medical History  Diagnosis Date  . Cancer     testicular  . Small bowel obstruction    Past Surgical History  Procedure Date    . Surgery scrotal / testicular 1982    for cancer  . Nephrectomy 1982    for cancer - left kidney removed   History reviewed. No pertinent family history. History   Social History  . Marital Status: Married    Spouse Name: N/A    Number of Children: N/A  . Years of Education: N/A   Occupational History  . Not on file.   Social History Main Topics  . Smoking status: Never Smoker   . Smokeless tobacco: Never Used  . Alcohol Use: No     former use x20 years ago  . Drug Use: No  . Sexually Active:    Other Topics Concern  . Not on file   Social History Narrative  . No narrative on file    Review of Systems: Negative except for pertinent positives and negatives in history of present illness.  Physical Exam: Blood pressure 129/80, pulse 79, temperature 97.8 F (36.6 C), temperature source Oral, resp. rate 16, SpO2 96.00%. General appearance: NAD, conversant, pleasant and cooperative Eyes: anicteric sclerae, moist conjunctivae; no lid-lag; PERRLA HENT: Atraumatic; oropharynx clear with moist mucous membranes and no mucosal ulcerations; normal hard and soft palate Neck: Trachea midline; FROM, supple, no thyromegaly or lymphadenopathy Lungs: CTA, with normal respiratory effort and no intercostal retractions CV: RRR, no MRGs  Abdomen: Soft, mildly tender; no masses or HSM, midline surgical scar Extremities: No peripheral  edema or extremity lymphadenopathy Skin: Normal temperature, turgor and texture; no rash, ulcers or subcutaneous nodules Psych: Appropriate affect, alert and oriented to person, place and time   Lab results: Basic Metabolic Panel:  Basename 03/06/11 1130  NA 139  K 3.6  CL 100  CO2 25  GLUCOSE 105*  BUN 17  CREATININE 0.89  CALCIUM 10.4  MG --  PHOS --   Liver Function Tests:  Basename 03/06/11 1130  AST 18  ALT 23  ALKPHOS 74  BILITOT 0.5  PROT 7.4  ALBUMIN 4.3    Basename 03/06/11 1130  LIPASE 23  AMYLASE --   CBC:  Basename  03/06/11 1130  WBC 9.1  NEUTROABS 7.5  HGB 16.3  HCT 45.4  MCV 88.5  PLT 204   Imaging results:  Ct Abdomen Pelvis W Contrast  03/06/2011  *RADIOLOGY REPORT*  Clinical Data: Mid abdominal pain, history of small bowel obstruction, testicular cancer with metastatic disease, history of nephrectomy.  CT ABDOMEN AND PELVIS WITH CONTRAST  Technique:  Multidetector CT imaging of the abdomen and pelvis was performed following the standard protocol during bolus administration of intravenous contrast.  Contrast: OMNIPAQUE IOHEXOL 300 MG/ML IV SOLN  Comparison: None.  Findings:  Normal hepatic contour.  No discrete hepatic lesions.  Normal gallbladder.  No intra or extrahepatic biliary duct dilatation.  A small amount of fluid is noted adjacent to the inferior aspect of the right lobe of the liver (image 32, series 2).  Post left-sided nephrectomy.  There is no residual tissue within the left nephrectomy bed.  The remaining right kidney enhances and excretes normally.  No discrete right-sided renal lesions.  No urinary obstruction.  The bilateral adrenal glands are normal. Normal pancreas and spleen.  Incidental note is made of several small splenules.  The majority of ingested enteric contrast remains within the stomach.  There is mild dilatation of several loops of small bowel within the lower abdomen ( index loop measures 2.8 cm in diameter, image 72, series 2), however there is no definitive upstream and downstream dilatation to suggest obstruction.  Several of these dilated loops of small bowel contain mottled lucencies favored to represent stool which may suggest an element of stasis.  The appendix is not definitely identified, however there is no stranding within the right lower quadrant.  No pneumoperitoneum, pneumatosis or portal venous gas.  Normal caliber abdominal aorta.  The major branch vessels of the abdominal aorta are all patent.  Post retroperitoneal and bilateral pelvic sidewall lymph node  dissection without evidence of retroperitoneal, mesenteric, pelvic or inguinal lymphadenopathy.  Limited visualization of the lower thorax demonstrates bibasilar atelectasis.  No focal airspace opacities.  No pleural effusion. Normal heart size.  No pericardial effusion.  No acute or aggressive osseous abnormalities.  Lumbar spine degenerative change.  IMPRESSION: 1.  Mild dilatation of several loops of small bowel within the mid/lower pelvis without a discrete transition point to suggest obstruction.  Several of these loops of small bowel contain feces which may suggest a component of enteric stasis.  2.  Post left-sided nephrectomy and extensive retroperitoneal and pelvic lymphadenectomy without evidence of recurrent disease.  Original Report Authenticated By: Waynard Reeds, M.D.   Dg Abd Acute W/chest  03/06/2011  *RADIOLOGY REPORT*  Clinical Data: Epigastric pain with nausea vomiting.  ACUTE ABDOMEN SERIES (ABDOMEN 2 VIEW & CHEST 1 VIEW)  Comparison: None.  Findings: The lungs are clear without focal infiltrate, edema, pneumothorax or pleural effusion. Interstitial markings  are diffusely coarsened with chronic features. The cardiopericardial silhouette is within normal limits for size. Imaged bony structures of the thorax are intact.  Upright film shows no evidence for intraperitoneal free air. Scattered small bowel air-fluid levels are seen on the upright film and a a dilated small bowel loop measuring up to 4.8 cm in diameter is seen in the left upper quadrant on the supine film.  There is stool is seen scattered along the course of the colon.  There are numerous surgical clips throughout the abdomen and pelvis.  IMPRESSION: No acute cardiopulmonary findings.  No intraperitoneal free air.  Mildly dilated small bowel loop with the scattered air-fluid levels in the small bowel.  The focal infectious or inflammatory ileus cannot be excluded. Small bowel obstruction is also a consideration.  CT imaging of the  abdomen and pelvis may be helpful to further evaluate.  Original Report Authenticated By: ERIC A. MANSELL, M.D.    Other results: EKG: pending   Assessment & Plan by Problem:  1. Nausea, Vomiting/SBO/Enteritis:  Based on CT and XR likely low grade SBO vs. Enteritis. ED asked [place] to admit for persistent vomiting under observation status. Patient has stable blood pressure vital signs he does not appear to be in any significant distress although he doesn't or serious and significant nausea vomiting mostly induced by his oral contrast for his CT scan. All labs are normal and do not indicate a significant level of dehydration. He has a history of abdominal surgery from a left nephrectomy from testicular cancer and is 56 years old he may have postop adhesions leading to small bowel obstruction.  Plan: We'll admit for observation. I put him on IV fluids normal saline. We'll repeat a KUB in the morning. I have put him on Phenergan for nausea and morphine for pain. There may be a component of chronic constipation leading to his small bowel traction and symptoms therefore I told him to limit the amount of morphine unless he was having significant abdominal pain. I have advanced his diet to clears to see how he does appear he does have bowel sounds on exam so I think it is in his diet is appropriate. Giving a one time dose of Reglan as a prokinetic in the ED. To be complete I have ordered one set of cardiac enzymes and a 12-lead EKG to rule out an anginal equivalent.   SignedAnderson Malta 03/06/2011, 8:12 PM

## 2011-03-06 NOTE — ED Notes (Signed)
Pt had episode of emesis in bathroom.

## 2011-03-06 NOTE — ED Notes (Signed)
Pt had episode of sudden emesis with orange output.  Talked to CT.  Told pt to give zofran 10 minutes to work and then sip slowly on contrast

## 2011-03-07 ENCOUNTER — Other Ambulatory Visit: Payer: Self-pay

## 2011-03-07 ENCOUNTER — Observation Stay (HOSPITAL_COMMUNITY): Payer: BC Managed Care – PPO

## 2011-03-07 DIAGNOSIS — E876 Hypokalemia: Secondary | ICD-10-CM | POA: Diagnosis present

## 2011-03-07 DIAGNOSIS — K56609 Unspecified intestinal obstruction, unspecified as to partial versus complete obstruction: Secondary | ICD-10-CM | POA: Diagnosis present

## 2011-03-07 LAB — BASIC METABOLIC PANEL
BUN: 17 mg/dL (ref 6–23)
Creatinine, Ser: 0.9 mg/dL (ref 0.50–1.35)
GFR calc Af Amer: >90 mL/min (ref >90)
GFR calc non Af Amer: >90 mL/min (ref >90)
Potassium: 3 mEq/L — ABNORMAL LOW (ref 3.5–5.1)

## 2011-03-07 LAB — URINALYSIS, ROUTINE W REFLEX MICROSCOPIC
Bilirubin Urine: NEGATIVE
Nitrite: NEGATIVE
Specific Gravity, Urine: 1.04 — ABNORMAL HIGH (ref 1.005–1.030)
Urobilinogen, UA: 0.2 mg/dL (ref 0.0–1.0)

## 2011-03-07 LAB — CREATININE, SERUM: GFR calc Af Amer: >90 mL/min (ref >90)

## 2011-03-07 LAB — CBC
HCT: 41.9 % (ref 39.0–52.0)
MCHC: 35.3 g/dL (ref 30.0–36.0)
MCV: 89.5 fL (ref 78.0–100.0)
Platelets: 220 10*3/uL (ref 150–400)
RDW: 12.5 % (ref 11.5–15.5)
WBC: 7.8 10*3/uL (ref 4.0–10.5)

## 2011-03-07 LAB — CARDIAC PANEL(CRET KIN+CKTOT+MB+TROPI)
Relative Index: INVALID (ref 0.0–2.5)
Troponin I: <0.3 ng/mL (ref <0.30)

## 2011-03-07 LAB — HEMOGLOBIN A1C: Mean Plasma Glucose: 105 mg/dL (ref <117)

## 2011-03-07 MED ORDER — SODIUM CHLORIDE 0.9 % IV SOLN
INTRAVENOUS | Status: DC
  Administered 2011-03-07 (×2): via INTRAVENOUS

## 2011-03-07 MED ORDER — PROMETHAZINE HCL 25 MG PO TABS
12.5000 mg | ORAL_TABLET | Freq: Four times a day (QID) | ORAL | Status: DC | PRN
  Administered 2011-03-07: 12.5 mg via ORAL
  Filled 2011-03-07: qty 1

## 2011-03-07 MED ORDER — ENOXAPARIN SODIUM 40 MG/0.4ML ~~LOC~~ SOLN
40.0000 mg | SUBCUTANEOUS | Status: DC
  Administered 2011-03-07 – 2011-03-09 (×3): 40 mg via SUBCUTANEOUS
  Filled 2011-03-07 (×3): qty 0.4

## 2011-03-07 MED ORDER — DOCUSATE SODIUM 100 MG PO CAPS
100.0000 mg | ORAL_CAPSULE | Freq: Two times a day (BID) | ORAL | Status: DC
  Administered 2011-03-07 – 2011-03-09 (×7): 100 mg via ORAL
  Filled 2011-03-07 (×7): qty 1

## 2011-03-07 MED ORDER — ACETAMINOPHEN 650 MG RE SUPP: 650.0000 mg | Freq: Four times a day (QID) | RECTAL | Status: DC | PRN

## 2011-03-07 MED ORDER — PROMETHAZINE HCL 25 MG/ML IJ SOLN
12.5000 mg | Freq: Four times a day (QID) | INTRAMUSCULAR | Status: DC | PRN
  Administered 2011-03-07 – 2011-03-08 (×2): 12.5 mg via INTRAVENOUS
  Filled 2011-03-07 (×2): qty 1

## 2011-03-07 MED ORDER — ACETAMINOPHEN 325 MG PO TABS
650.0000 mg | ORAL_TABLET | Freq: Four times a day (QID) | ORAL | Status: DC | PRN
  Administered 2011-03-07 (×2): 650 mg via ORAL
  Filled 2011-03-07 (×2): qty 2

## 2011-03-07 MED ORDER — POTASSIUM CHLORIDE 10 MEQ/100ML IV SOLN
10.0000 meq | INTRAVENOUS | Status: AC
  Filled 2011-03-07 (×4): qty 100

## 2011-03-07 MED ORDER — MORPHINE SULFATE 2 MG/ML IJ SOLN
4.0000 mg | INTRAMUSCULAR | Status: DC | PRN
  Administered 2011-03-07: 4 mg via INTRAVENOUS
  Filled 2011-03-07: qty 2

## 2011-03-07 MED ORDER — POTASSIUM CHLORIDE IN NACL 40-0.9 MEQ/L-% IV SOLN
INTRAVENOUS | Status: DC
  Administered 2011-03-07 – 2011-03-08 (×4): via INTRAVENOUS
  Filled 2011-03-07 (×6): qty 1000

## 2011-03-07 MED ORDER — ZOLPIDEM TARTRATE 5 MG PO TABS: 5.0000 mg | ORAL_TABLET | Freq: Every evening | ORAL | Status: DC | PRN

## 2011-03-07 NOTE — Progress Notes (Signed)
PATIENT DETAILS Name: Calvin Burke Age: 56 y.o. Sex: male Date of Birth: 05/11/1955 Admit Date: 03/06/2011 OZH:[id number] JR,Calvin RANSOME, MD, MD Emergency contact:   CONSULTS: None  Interval History: Calvin Burke is a 56 year old male with a PMH of testicular cancer, s/p left nephrectomy and orchiectomy who was admitted on 03/06/11 with a partial SBO and ? Enteric stasis based on CT scan results.    ROS: Calvin Burke had an episode of "projectile vomiting" this morning according to the nurse.  He has not had any other episodes of vomiting and denies abdominal pain.  He says that he has passed flatus today, but has not had a BM.   Objective: Vital signs in last 24 hours: Temp:  [97.8 F (36.6 C)-98.5 F (36.9 C)] 98.5 F (36.9 C) (01/22 1511) Pulse Rate:  [69-93] 88  (01/22 1511) Resp:  [16-20] 18  (01/22 1511) BP: (128-149)/(68-80) 144/77 mmHg (01/22 1511) SpO2:  [93 %-98 %] 97 % (01/22 1511) Weight:  [88.814 kg (195 lb 12.8 oz)] 88.814 kg (195 lb 12.8 oz) (01/21 2020) Weight change:  Last BM Date: 03/06/11  Intake/Output from previous day:  Intake/Output Summary (Last 24 hours) at 03/07/11 1533 Last data filed at 03/07/11 1512  Gross per 24 hour  Intake    720 ml  Output    300 ml  Net    420 ml     Physical Exam:  Gen:  NAD Cardiovascular:  RRR, No M/R/G Respiratory: Lungs CTAB Gastrointestinal: Abdomen soft, NT/ND with hyperactive bowel sounds. Extremities: No C/E/C     Lab Results: Basic Metabolic Panel:  Lab 03/07/11 9629 03/07/11 0117 03/06/11 1130  NA 137 -- 139  K 3.0* -- 3.6  CL 101 -- 100  CO2 28 -- 25  GLUCOSE 107* -- 105*  BUN 17 -- 17  CREATININE 0.90 0.87 0.89  CALCIUM 8.7 -- 10.4  MG -- -- --  PHOS -- -- --   GFR Estimated Creatinine Clearance: 107.8 ml/min (by C-G formula based on Cr of 0.9). Liver Function Tests:  Lab 03/06/11 1130  AST 18  ALT 23  ALKPHOS 74  BILITOT 0.5  PROT 7.4  ALBUMIN 4.3    Lab 03/06/11 1130    LIPASE 23  AMYLASE --    CBC:  Lab 03/07/11 0117 03/06/11 1130  WBC 7.8 9.1  NEUTROABS -- 7.5  HGB 14.8 16.3  HCT 41.9 45.4  MCV 89.5 88.5  PLT 220 204   Cardiac Enzymes:  Lab 03/07/11 0100  CKTOTAL 80  CKMB 2.6  CKMBINDEX --  TROPONINI <0.30   Hgb A1c  Basename 03/07/11 0117  HGBA1C 5.3   Thyroid function studies  Basename 03/07/11 0117  TSH 0.550  T4TOTAL --  T3FREE --  THYROIDAB --    Studies/Results: Dg Abd 1 View  03/07/2011  *RADIOLOGY REPORT*  Clinical Data: Follow up small bowel obstruction pattern  ABDOMEN - 1 VIEW  Comparison: 03/06/2011  Findings: Enteric contrast material from 03/06/2011 has progressed into the proximal colon.  There are a few persistent dilated loops of small bowel which are not significantly changed from previous exam.  Surgical clips are seen scattered throughout the abdomen and pelvis.  IMPRESSION:  1.  No change in partial small bowel obstruction pattern.  Original Report Authenticated By: Rosealee Albee, M.D.   Ct Abdomen Pelvis W Contrast  03/06/2011  *RADIOLOGY REPORT*  Clinical Data: Mid abdominal pain, history of small bowel obstruction, testicular cancer with metastatic disease, history  of nephrectomy.  CT ABDOMEN AND PELVIS WITH CONTRAST  Technique:  Multidetector CT imaging of the abdomen and pelvis was performed following the standard protocol during bolus administration of intravenous contrast.  Contrast: OMNIPAQUE IOHEXOL 300 MG/ML IV SOLN  Comparison: None.  Findings:  Normal hepatic contour.  No discrete hepatic lesions.  Normal gallbladder.  No intra or extrahepatic biliary duct dilatation.  A small amount of fluid is noted adjacent to the inferior aspect of the right lobe of the liver (image 32, series 2).  Post left-sided nephrectomy.  There is no residual tissue within the left nephrectomy bed.  The remaining right kidney enhances and excretes normally.  No discrete right-sided renal lesions.  No urinary  obstruction.  The bilateral adrenal glands are normal. Normal pancreas and spleen.  Incidental note is made of several small splenules.  The majority of ingested enteric contrast remains within the stomach.  There is mild dilatation of several loops of small bowel within the lower abdomen ( index loop measures 2.8 cm in diameter, image 72, series 2), however there is no definitive upstream and downstream dilatation to suggest obstruction.  Several of these dilated loops of small bowel contain mottled lucencies favored to represent stool which may suggest an element of stasis.  The appendix is not definitely identified, however there is no stranding within the right lower quadrant.  No pneumoperitoneum, pneumatosis or portal venous gas.  Normal caliber abdominal aorta.  The major branch vessels of the abdominal aorta are all patent.  Post retroperitoneal and bilateral pelvic sidewall lymph node dissection without evidence of retroperitoneal, mesenteric, pelvic or inguinal lymphadenopathy.  Limited visualization of the lower thorax demonstrates bibasilar atelectasis.  No focal airspace opacities.  No pleural effusion. Normal heart size.  No pericardial effusion.  No acute or aggressive osseous abnormalities.  Lumbar spine degenerative change.  IMPRESSION: 1.  Mild dilatation of several loops of small bowel within the mid/lower pelvis without a discrete transition point to suggest obstruction.  Several of these loops of small bowel contain feces which may suggest a component of enteric stasis.  2.  Post left-sided nephrectomy and extensive retroperitoneal and pelvic lymphadenectomy without evidence of recurrent disease.  Original Report Authenticated By: Waynard Reeds, M.D.   Dg Abd Acute W/chest  03/06/2011  *RADIOLOGY REPORT*  Clinical Data: Epigastric pain with nausea vomiting.  ACUTE ABDOMEN SERIES (ABDOMEN 2 VIEW & CHEST 1 VIEW)  Comparison: None.  Findings: The lungs are clear without focal infiltrate,  edema, pneumothorax or pleural effusion. Interstitial markings are diffusely coarsened with chronic features. The cardiopericardial silhouette is within normal limits for size. Imaged bony structures of the thorax are intact.  Upright film shows no evidence for intraperitoneal free air. Scattered small bowel air-fluid levels are seen on the upright film and a a dilated small bowel loop measuring up to 4.8 cm in diameter is seen in the left upper quadrant on the supine film.  There is stool is seen scattered along the course of the colon.  There are numerous surgical clips throughout the abdomen and pelvis.  IMPRESSION: No acute cardiopulmonary findings.  No intraperitoneal free air.  Mildly dilated small bowel loop with the scattered air-fluid levels in the small bowel.  The focal infectious or inflammatory ileus cannot be excluded. Small bowel obstruction is also a consideration.  CT imaging of the abdomen and pelvis may be helpful to further evaluate.  Original Report Authenticated By: ERIC A. MANSELL, M.D.    Medications: Scheduled  Meds:    . docusate sodium  100 mg Oral BID  . enoxaparin  40 mg Subcutaneous Q24H  . metoCLOPramide (REGLAN) injection  10 mg Intravenous Once  . potassium chloride  10 mEq Intravenous Q1 Hr x 4   Continuous Infusions:    . 0.9 % NaCl with KCl 40 mEq / L    . DISCONTD: sodium chloride 125 mL/hr at 03/07/11 0513   PRN Meds:.acetaminophen, acetaminophen, iohexol, morphine, morphine injection, ondansetron, promethazine, promethazine, zolpidem, DISCONTD:  morphine injection, DISCONTD: ondansetron Antibiotics: Anti-infectives    None       Assessment/Plan:  Principal Problem:  *SBO (small bowel obstruction) The patient was admitted.  He was given a dose of reglan and put on a stool softener, per records, to address ? Enteric stasis based on the CT scan findings noted above.  He likely has a partial SBO related to adhesions, and possible enteric stasis.  Would  avoid pro motility agents until SBO no longer a consideration.  Continue bowel rest with CL diet only.  If he develops another episode of N/V, would place an NG tube to low intermittent suction.  If no improvement in exam or films, would involve surgery in his care. Active Problems:  Nausea & vomiting Secondary to partial SBO +/- enteric stasis.  Continue anti-emetics PRN.  Place NG tube if needed.  Hypokalemia Secondary to GI losses.  Will replace.  Check magnesium.     LOS: 1 day   Hillery Aldo, MD Pager [phone]  03/07/2011, 3:33 PM

## 2011-03-07 NOTE — ED Provider Notes (Signed)
Medical screening examination/treatment/procedure(s) were performed by non-physician practitioner and as supervising physician I was immediately available for consultation/collaboration.  Loren Racer, MD 03/07/11 [phone]

## 2011-03-08 ENCOUNTER — Observation Stay (HOSPITAL_COMMUNITY): Payer: BC Managed Care – PPO

## 2011-03-08 LAB — BASIC METABOLIC PANEL
BUN: 13 mg/dL (ref 6–23)
Creatinine, Ser: 0.85 mg/dL (ref 0.50–1.35)
GFR calc Af Amer: >90 mL/min (ref >90)
GFR calc non Af Amer: >90 mL/min (ref >90)
Potassium: 3.5 mEq/L (ref 3.5–5.1)

## 2011-03-08 LAB — CBC
MCHC: 36 g/dL (ref 30.0–36.0)
Platelets: 176 10*3/uL (ref 150–400)
RDW: 12.1 % (ref 11.5–15.5)

## 2011-03-08 NOTE — Progress Notes (Signed)
Subjective: Feeling better.  Denies any specific abdominal pain.  Tolerating clear liquid diet.  Having flatus.  Wondering when he can go home.  Objective: Vital signs in last 24 hours: Filed Vitals:   03/07/11 2206 03/08/11 0529 03/08/11 0920 03/08/11 1415  BP: 138/80 132/80 133/79 137/79  Pulse: 73 72 72 72  Temp: 98.6 F (37 C) 98.1 F (36.7 C) 97.9 F (36.6 C) 98.1 F (36.7 C)  TempSrc: Oral Oral Oral Oral  Resp: 18 18 18 16   Height:      Weight:      SpO2: 96% 98% 95% 97%   Weight change:   Intake/Output Summary (Last 24 hours) at 03/08/11 1616 Last data filed at 03/08/11 1000  Gross per 24 hour  Intake 1886.67 ml  Output      0 ml  Net 1886.67 ml    Physical Exam: General: Awake, Oriented, No acute distress. HEENT: EOMI. Neck: Supple CV: S1 and S2 Lungs: Clear to ascultation bilaterally Abdomen: Soft, Nontender, Nondistended, +bowel sounds. Ext: Good pulses. Trace edema.  Lab Results:  [facility] 03/08/11 0515 03/07/11 0458  NA 139 137  K 3.5 3.0*  CL 105 101  CO2 28 28  GLUCOSE 86 107*  BUN 13 17  CREATININE 0.85 0.90  CALCIUM 8.7 8.7  MG 1.5 --  PHOS -- --    Basename 03/06/11 1130  AST 18  ALT 23  ALKPHOS 74  BILITOT 0.5  PROT 7.4  ALBUMIN 4.3    Basename 03/06/11 1130  LIPASE 23  AMYLASE --    Basename 03/08/11 0515 03/07/11 0117 03/06/11 1130  WBC 4.7 7.8 --  NEUTROABS -- -- 7.5  HGB 14.1 14.8 --  HCT 39.2 41.9 --  MCV 89.7 89.5 --  PLT 176 220 --    Basename 03/07/11 0100  CKTOTAL 80  CKMB 2.6  CKMBINDEX --  TROPONINI <0.30   No components found with this basename: POCBNP:3 No results found for this basename: DDIMER:2 in the last 72 hours  Basename 03/07/11 0117  HGBA1C 5.3   No results found for this basename: CHOL:2,HDL:2,LDLCALC:2,TRIG:2,CHOLHDL:2,LDLDIRECT:2 in the last 72 hours  Basename 03/07/11 0117  TSH 0.550  T4TOTAL --  T3FREE --  THYROIDAB --   No results found for this basename:  VITAMINB12:2,FOLATE:2,FERRITIN:2,TIBC:2,IRON:2,RETICCTPCT:2 in the last 72 hours  Micro Results: No results found for this or any previous visit (from the past 240 hour(s)).  Studies/Results: Dg Abd 1 View  03/07/2011  *RADIOLOGY REPORT*  Clinical Data: Follow up small bowel obstruction pattern  ABDOMEN - 1 VIEW  Comparison: 03/06/2011  Findings: Enteric contrast material from 03/06/2011 has progressed into the proximal colon.  There are a few persistent dilated loops of small bowel which are not significantly changed from previous exam.  Surgical clips are seen scattered throughout the abdomen and pelvis.  IMPRESSION:  1.  No change in partial small bowel obstruction pattern.  Original Report Authenticated By: Rosealee Albee, M.D.   Ct Abdomen Pelvis W Contrast  03/06/2011  *RADIOLOGY REPORT*  Clinical Data: Mid abdominal pain, history of small bowel obstruction, testicular cancer with metastatic disease, history of nephrectomy.  CT ABDOMEN AND PELVIS WITH CONTRAST  Technique:  Multidetector CT imaging of the abdomen and pelvis was performed following the standard protocol during bolus administration of intravenous contrast.  Contrast: OMNIPAQUE IOHEXOL 300 MG/ML IV SOLN  Comparison: None.  Findings:  Normal hepatic contour.  No discrete hepatic lesions.  Normal gallbladder.  No intra or extrahepatic biliary  duct dilatation.  A small amount of fluid is noted adjacent to the inferior aspect of the right lobe of the liver (image 32, series 2).  Post left-sided nephrectomy.  There is no residual tissue within the left nephrectomy bed.  The remaining right kidney enhances and excretes normally.  No discrete right-sided renal lesions.  No urinary obstruction.  The bilateral adrenal glands are normal. Normal pancreas and spleen.  Incidental note is made of several small splenules.  The majority of ingested enteric contrast remains within the stomach.  There is mild dilatation of several loops of small  bowel within the lower abdomen ( index loop measures 2.8 cm in diameter, image 72, series 2), however there is no definitive upstream and downstream dilatation to suggest obstruction.  Several of these dilated loops of small bowel contain mottled lucencies favored to represent stool which may suggest an element of stasis.  The appendix is not definitely identified, however there is no stranding within the right lower quadrant.  No pneumoperitoneum, pneumatosis or portal venous gas.  Normal caliber abdominal aorta.  The major branch vessels of the abdominal aorta are all patent.  Post retroperitoneal and bilateral pelvic sidewall lymph node dissection without evidence of retroperitoneal, mesenteric, pelvic or inguinal lymphadenopathy.  Limited visualization of the lower thorax demonstrates bibasilar atelectasis.  No focal airspace opacities.  No pleural effusion. Normal heart size.  No pericardial effusion.  No acute or aggressive osseous abnormalities.  Lumbar spine degenerative change.  IMPRESSION: 1.  Mild dilatation of several loops of small bowel within the mid/lower pelvis without a discrete transition point to suggest obstruction.  Several of these loops of small bowel contain feces which may suggest a component of enteric stasis.  2.  Post left-sided nephrectomy and extensive retroperitoneal and pelvic lymphadenectomy without evidence of recurrent disease.  Original Report Authenticated By: Waynard Reeds, M.D.   Dg Abd 2 Views  03/08/2011  *RADIOLOGY REPORT*  Clinical Data: Partial small bowel obstruction.  ABDOMEN - 2 VIEW  Comparison: 03/07/2011  Findings: Several mildly dilated small bowel loops persist in the central abdomen, however degree of small bowel dilatation appears mildly improved.  Contrast persists within the colon, which is nondilated.  There is no evidence of free air.  Numerous surgical clips are seen throughout the abdomen and pelvis.  IMPRESSION: Mild decrease in small bowel  dilatation since prior study.  Original Report Authenticated By: Danae Orleans, M.D.    Medications: I have reviewed the patient's current medications. Scheduled Meds:   . docusate sodium  100 mg Oral BID  . enoxaparin  40 mg Subcutaneous Q24H  . potassium chloride  10 mEq Intravenous Q1 Hr x 4   Continuous Infusions:   . 0.9 % NaCl with KCl 40 mEq / L 50 mL/hr at 03/08/11 1234   PRN Meds:.acetaminophen, acetaminophen, morphine, promethazine, promethazine, zolpidem  Assessment/Plan: SBO (small bowel obstruction)  Was initially given a dose of reglan and put on a stool softener.  Imaging showed dilatation of several loops of small bowel without discrete transition point to suggest obstruction.  Likely has a partial SBO.  Tolerating clear liquid diet.  Advance diet as tolerated.  If any worse symptoms of nausea or vomiting and likely will need surgery consultation and will need NG placement with intermittent suction.    Nausea & vomiting  Secondary to partial SBO +/- enteric stasis. Continue anti-emetics PRN. Place NG tube if needed.  Stable at this time.  Hypokalemia  Secondary to  GI losses.  Resolved.    Disposition Pending improvement in tolerating a diet.   LOS: 2 days  Evelen Vazguez A, MD 03/08/2011, 4:16 PM

## 2011-03-09 MED ORDER — POLYETHYLENE GLYCOL 3350 17 GM/SCOOP PO POWD: 17.0000 g | Freq: Every day | ORAL | Status: AC | PRN

## 2011-03-09 NOTE — Discharge Summary (Signed)
Discharge Summary  Calvin Burke MR#: [id number]  DOB:[birth year]  Date of Admission: 03/06/2011 Date of Discharge: 03/09/2011  Patient's PCP: Franciso Bend, MD, MD  Attending Physician:Anitta Tenny A  Consults: None  Discharge Diagnoses: Principal Problem:  *SBO (small bowel obstruction) Active Problems:  Nausea & vomiting  Hypokalemia  Brief Admitting History and Physical 56 year old Caucasian male with history of abdominal surgery who presented on 03/06/2011 with nausea and vomiting.  Discharge Medications Medication List  As of 03/09/2011  2:03 PM   TAKE these medications         bismuth subsalicylate 262 MG/15ML suspension   Commonly known as: PEPTO BISMOL   Take 15 mLs by mouth every 6 (six) hours as needed. For stomach pain      polyethylene glycol powder powder   Commonly known as: GLYCOLAX/MIRALAX   Take 17 g by mouth daily as needed (Constipation. Hold for diarrhea.).      simvastatin 40 MG tablet   Commonly known as: ZOCOR   Take 40 mg by mouth every evening.      vitamin C 500 MG tablet   Commonly known as: ASCORBIC ACID   Take 1,000 mg by mouth daily.            Hospital Course: SBO (small bowel obstruction)  Was initially given a dose of reglan and put on a stool softener.  Imaging showed dilatation of several loops of small bowel without discrete transition point to suggest obstruction.  Likely has a partial SBO.  Initially the patient was started on clear liquid diet as his symptoms improved with conservative management such as IV fluids. Diet advanced as tolerated to low-residue diet without any nausea or vomiting.  Initially consider placing an NG tube however his symptoms improved during the course of hospital stay and did not require NG placement.  Prior to discharge patient was tolerating low-residue diet without nausea or vomiting.  Nausea & vomiting  Secondary to partial SBO +/- enteric stasis. Continue anti-emetics PRN.   Resolved during the course of hospital stay.  Hypokalemia  Secondary to GI losses.  Resolved with replacement.  Started the patient to have his electrolytes checked with his primary care physician at the next clinic visit.  Day of Discharge BP 128/75  Pulse 66  Temp(Src) 98.4 F (36.9 C) (Oral)  Resp 18  Ht 6\' 2"  (1.88 m)  Wt 88.814 kg (195 lb 12.8 oz)  BMI 25.14 kg/m2  SpO2 98%  Results for orders placed during the hospital encounter of 03/06/11 (from the past 48 hour(s))  MAGNESIUM     Status: Normal   Collection Time   03/08/11  5:15 AM      Component Value Range Comment   Magnesium 1.5  1.5 - 2.5 (mg/dL)   BASIC METABOLIC PANEL     Status: Normal   Collection Time   03/08/11  5:15 AM      Component Value Range Comment   Sodium 139  135 - 145 (mEq/L)    Potassium 3.5  3.5 - 5.1 (mEq/L)    Chloride 105  96 - 112 (mEq/L)    CO2 28  19 - 32 (mEq/L)    Glucose, Bld 86  70 - 99 (mg/dL)    BUN 13  6 - 23 (mg/dL)    Creatinine, Ser 4.09  0.50 - 1.35 (mg/dL)    Calcium 8.7  8.4 - 10.5 (mg/dL)    GFR calc non Af Amer >90  >90 (mL/min)  GFR calc Af Amer >90  >90 (mL/min)   CBC     Status: Normal   Collection Time   03/08/11  5:15 AM      Component Value Range Comment   WBC 4.7  4.0 - 10.5 (K/uL)    RBC 4.37  4.22 - 5.81 (MIL/uL)    Hemoglobin 14.1  13.0 - 17.0 (g/dL)    HCT 16.1  09.6 - 04.5 (%)    MCV 89.7  78.0 - 100.0 (fL)    MCH 32.3  26.0 - 34.0 (pg)    MCHC 36.0  30.0 - 36.0 (g/dL)    RDW 40.9  81.1 - 91.4 (%)    Platelets 176  150 - 400 (K/uL)     Dg Abd 1 View  03/07/2011  *RADIOLOGY REPORT*  Clinical Data: Follow up small bowel obstruction pattern  ABDOMEN - 1 VIEW  Comparison: 03/06/2011  Findings: Enteric contrast material from 03/06/2011 has progressed into the proximal colon.  There are a few persistent dilated loops of small bowel which are not significantly changed from previous exam.  Surgical clips are seen scattered throughout the abdomen and pelvis.   IMPRESSION:  1.  No change in partial small bowel obstruction pattern.  Original Report Authenticated By: Rosealee Albee, M.D.   Ct Abdomen Pelvis W Contrast  03/06/2011  *RADIOLOGY REPORT*  Clinical Data: Mid abdominal pain, history of small bowel obstruction, testicular cancer with metastatic disease, history of nephrectomy.  CT ABDOMEN AND PELVIS WITH CONTRAST  Technique:  Multidetector CT imaging of the abdomen and pelvis was performed following the standard protocol during bolus administration of intravenous contrast.  Contrast: OMNIPAQUE IOHEXOL 300 MG/ML IV SOLN  Comparison: None.  Findings:  Normal hepatic contour.  No discrete hepatic lesions.  Normal gallbladder.  No intra or extrahepatic biliary duct dilatation.  A small amount of fluid is noted adjacent to the inferior aspect of the right lobe of the liver (image 32, series 2).  Post left-sided nephrectomy.  There is no residual tissue within the left nephrectomy bed.  The remaining right kidney enhances and excretes normally.  No discrete right-sided renal lesions.  No urinary obstruction.  The bilateral adrenal glands are normal. Normal pancreas and spleen.  Incidental note is made of several small splenules.  The majority of ingested enteric contrast remains within the stomach.  There is mild dilatation of several loops of small bowel within the lower abdomen ( index loop measures 2.8 cm in diameter, image 72, series 2), however there is no definitive upstream and downstream dilatation to suggest obstruction.  Several of these dilated loops of small bowel contain mottled lucencies favored to represent stool which may suggest an element of stasis.  The appendix is not definitely identified, however there is no stranding within the right lower quadrant.  No pneumoperitoneum, pneumatosis or portal venous gas.  Normal caliber abdominal aorta.  The major branch vessels of the abdominal aorta are all patent.  Post retroperitoneal and bilateral  pelvic sidewall lymph node dissection without evidence of retroperitoneal, mesenteric, pelvic or inguinal lymphadenopathy.  Limited visualization of the lower thorax demonstrates bibasilar atelectasis.  No focal airspace opacities.  No pleural effusion. Normal heart size.  No pericardial effusion.  No acute or aggressive osseous abnormalities.  Lumbar spine degenerative change.  IMPRESSION: 1.  Mild dilatation of several loops of small bowel within the mid/lower pelvis without a discrete transition point to suggest obstruction.  Several of these loops of small bowel contain  feces which may suggest a component of enteric stasis.  2.  Post left-sided nephrectomy and extensive retroperitoneal and pelvic lymphadenectomy without evidence of recurrent disease.  Original Report Authenticated By: Waynard Reeds, M.D.   Dg Abd 2 Views  03/08/2011  *RADIOLOGY REPORT*  Clinical Data: Partial small bowel obstruction.  ABDOMEN - 2 VIEW  Comparison: 03/07/2011  Findings: Several mildly dilated small bowel loops persist in the central abdomen, however degree of small bowel dilatation appears mildly improved.  Contrast persists within the colon, which is nondilated.  There is no evidence of free air.  Numerous surgical clips are seen throughout the abdomen and pelvis.  IMPRESSION: Mild decrease in small bowel dilatation since prior study.  Original Report Authenticated By: Danae Orleans, M.D.   Dg Abd Acute W/chest  03/06/2011  *RADIOLOGY REPORT*  Clinical Data: Epigastric pain with nausea vomiting.  ACUTE ABDOMEN SERIES (ABDOMEN 2 VIEW & CHEST 1 VIEW)  Comparison: None.  Findings: The lungs are clear without focal infiltrate, edema, pneumothorax or pleural effusion. Interstitial markings are diffusely coarsened with chronic features. The cardiopericardial silhouette is within normal limits for size. Imaged bony structures of the thorax are intact.  Upright film shows no evidence for intraperitoneal free air. Scattered small  bowel air-fluid levels are seen on the upright film and a a dilated small bowel loop measuring up to 4.8 cm in diameter is seen in the left upper quadrant on the supine film.  There is stool is seen scattered along the course of the colon.  There are numerous surgical clips throughout the abdomen and pelvis.  IMPRESSION: No acute cardiopulmonary findings.  No intraperitoneal free air.  Mildly dilated small bowel loop with the scattered air-fluid levels in the small bowel.  The focal infectious or inflammatory ileus cannot be excluded. Small bowel obstruction is also a consideration.  CT imaging of the abdomen and pelvis may be helpful to further evaluate.  Original Report Authenticated By: ERIC A. MANSELL, M.D.   Disposition: Home  Diet: Low residue diet  Activity: Resume as tolerated  Follow-up Appts: Discharge Orders    Future Orders Please Complete By Expires   Increase activity slowly      Discharge instructions      Comments:   Followup with STAFFORD JR,WILLIE RANSOME, MD (PCP) or new primary care physician in 2-3 weeks.  Diet: Low Residue diet.      TESTS THAT NEED FOLLOW-UP None  Time spent on discharge, talking to the patient, and coordinating care: 25 mins.  Signed: Cristal Ford, MD 03/09/2011, 2:03 PM

## 2011-03-09 NOTE — Progress Notes (Signed)
Subjective: Tolerating solid diet.  Denies any abdominal pain, nausea or vomiting.  Objective: Vital signs in last 24 hours: Filed Vitals:   03/22/11 1415 03-22-2011 1700 03-22-2011 2141 03/09/11 0524  BP: 137/79 117/80 127/82 128/75  Pulse: 72 73 68 66  Temp: 98.1 F (36.7 C) 97.9 F (36.6 C) 97.9 F (36.6 C) 98.4 F (36.9 C)  TempSrc: Oral Oral Oral Oral  Resp: 16 16 17 18   Height:      Weight:      SpO2: 97% 96% 98% 98%   Weight change:   Intake/Output Summary (Last 24 hours) at 03/09/11 1400 Last data filed at 03/09/11 0739  Gross per 24 hour  Intake 2108.33 ml  Output      0 ml  Net 2108.33 ml    Physical Exam: General: Awake, Oriented, No acute distress. HEENT: EOMI. Neck: Supple CV: S1 and S2 Lungs: Clear to ascultation bilaterally Abdomen: Soft, Nontender, Nondistended, +bowel sounds. Ext: Good pulses. Trace edema.  Lab Results:  Basename 03-22-2011 0515 03/07/11 0458  NA 139 137  K 3.5 3.0*  CL 105 101  CO2 28 28  GLUCOSE 86 107*  BUN 13 17  CREATININE 0.85 0.90  CALCIUM 8.7 8.7  MG 1.5 --  PHOS -- --   No results found for this basename: AST:2,ALT:2,ALKPHOS:2,BILITOT:2,PROT:2,ALBUMIN:2 in the last 72 hours No results found for this basename: LIPASE:2,AMYLASE:2 in the last 72 hours  Basename 2011/03/22 0515 03/07/11 0117  WBC 4.7 7.8  NEUTROABS -- --  HGB 14.1 14.8  HCT 39.2 41.9  MCV 89.7 89.5  PLT 176 220    Basename 03/07/11 0100  CKTOTAL 80  CKMB 2.6  CKMBINDEX --  TROPONINI <0.30   No components found with this basename: POCBNP:3 No results found for this basename: DDIMER:2 in the last 72 hours  Basename 03/07/11 0117  HGBA1C 5.3   No results found for this basename: CHOL:2,HDL:2,LDLCALC:2,TRIG:2,CHOLHDL:2,LDLDIRECT:2 in the last 72 hours  Basename 03/07/11 0117  TSH 0.550  T4TOTAL --  T3FREE --  THYROIDAB --   No results found for this basename: VITAMINB12:2,FOLATE:2,FERRITIN:2,TIBC:2,IRON:2,RETICCTPCT:2 in the last 72  hours  Micro Results: No results found for this or any previous visit (from the past 240 hour(s)).  Studies/Results: Dg Abd 2 Views  03/22/11  *RADIOLOGY REPORT*  Clinical Data: Partial small bowel obstruction.  ABDOMEN - 2 VIEW  Comparison: 03/07/2011  Findings: Several mildly dilated small bowel loops persist in the central abdomen, however degree of small bowel dilatation appears mildly improved.  Contrast persists within the colon, which is nondilated.  There is no evidence of free air.  Numerous surgical clips are seen throughout the abdomen and pelvis.  IMPRESSION: Mild decrease in small bowel dilatation since prior study.  Original Report Authenticated By: Danae Orleans, M.D.    Medications: I have reviewed the patient's current medications. Scheduled Meds:    . docusate sodium  100 mg Oral BID  . enoxaparin  40 mg Subcutaneous Q24H   Continuous Infusions:    . 0.9 % NaCl with KCl 40 mEq / L 50 mL/hr at 03/09/11 0600   PRN Meds:.acetaminophen, acetaminophen, morphine, promethazine, promethazine, zolpidem  Assessment/Plan: SBO (small bowel obstruction)  Was initially given a dose of reglan and put on a stool softener.  Imaging showed dilatation of several loops of small bowel without discrete transition point to suggest obstruction.  Likely has a partial SBO.  Tolerating clear liquid diet, diet advanced as tolerated to low-residue diet without any nausea or  vomiting.    Nausea & vomiting  Secondary to partial SBO +/- enteric stasis. Continue anti-emetics PRN. Place NG tube if needed.  Stable at this time.  Hypokalemia  Secondary to GI losses.  Resolved.    Disposition Discharge patient home today.   LOS: 3 days  Frankey Botting A, MD 03/09/2011, 2:00 PM

## 2011-04-08 ENCOUNTER — Other Ambulatory Visit: Payer: Self-pay | Admitting: Family Medicine

## 2011-05-29 ENCOUNTER — Other Ambulatory Visit: Payer: Self-pay | Admitting: Family Medicine

## 2011-09-20 ENCOUNTER — Other Ambulatory Visit (INDEPENDENT_AMBULATORY_CARE_PROVIDER_SITE_OTHER): Payer: BC Managed Care – PPO

## 2011-09-20 ENCOUNTER — Encounter: Payer: Self-pay | Admitting: Internal Medicine

## 2011-09-20 ENCOUNTER — Ambulatory Visit (INDEPENDENT_AMBULATORY_CARE_PROVIDER_SITE_OTHER): Payer: BC Managed Care – PPO | Admitting: Internal Medicine

## 2011-09-20 VITALS — BP 128/84 | HR 69 | Temp 98.6°F | Resp 16 | Wt 190.2 lb

## 2011-09-20 DIAGNOSIS — K219 Gastro-esophageal reflux disease without esophagitis: Secondary | ICD-10-CM

## 2011-09-20 DIAGNOSIS — Z0000 Encounter for general adult medical examination without abnormal findings: Secondary | ICD-10-CM | POA: Insufficient documentation

## 2011-09-20 DIAGNOSIS — E785 Hyperlipidemia, unspecified: Secondary | ICD-10-CM

## 2011-09-20 LAB — URINALYSIS, ROUTINE W REFLEX MICROSCOPIC
Bilirubin Urine: NEGATIVE
Hgb urine dipstick: NEGATIVE
Ketones, ur: NEGATIVE
Leukocytes, UA: NEGATIVE
Nitrite: NEGATIVE
Urobilinogen, UA: 0.2 (ref 0.0–1.0)

## 2011-09-20 LAB — COMPREHENSIVE METABOLIC PANEL
ALT: 27 U/L (ref 0–53)
AST: 24 U/L (ref 0–37)
Albumin: 4.2 g/dL (ref 3.5–5.2)
Alkaline Phosphatase: 64 U/L (ref 39–117)
BUN: 20 mg/dL (ref 6–23)
Potassium: 3.7 mEq/L (ref 3.5–5.1)
Sodium: 139 mEq/L (ref 135–145)
Total Protein: 6.8 g/dL (ref 6.0–8.3)

## 2011-09-20 LAB — CBC WITH DIFFERENTIAL/PLATELET
Basophils Relative: 0.4 % (ref 0.0–3.0)
Eosinophils Absolute: 0 10*3/uL (ref 0.0–0.7)
MCHC: 34.8 g/dL (ref 30.0–36.0)
MCV: 93.2 fl (ref 78.0–100.0)
Monocytes Absolute: 0.6 10*3/uL (ref 0.1–1.0)
Neutro Abs: 3.4 10*3/uL (ref 1.4–7.7)
Neutrophils Relative %: 61.8 % (ref 43.0–77.0)
RBC: 4.98 Mil/uL (ref 4.22–5.81)
RDW: 12.4 % (ref 11.5–14.6)

## 2011-09-20 LAB — FECAL OCCULT BLOOD, GUAIAC: Fecal Occult Blood: NEGATIVE

## 2011-09-20 LAB — LIPID PANEL
HDL: 48.6 mg/dL (ref >39.00)
Total CHOL/HDL Ratio: 5
VLDL: 17.4 mg/dL (ref 0.0–40.0)

## 2011-09-20 MED ORDER — OMEPRAZOLE 40 MG PO CPDR: 40.0000 mg | DELAYED_RELEASE_CAPSULE | Freq: Every day | ORAL | Status: DC

## 2011-09-20 MED ORDER — SIMVASTATIN 40 MG PO TABS: 40.0000 mg | ORAL_TABLET | Freq: Every evening | ORAL | Status: DC

## 2011-09-20 NOTE — Patient Instructions (Addendum)
Health Maintenance, Males A healthy lifestyle and preventative care can promote health and wellness.  Maintain regular health, dental, and eye exams.   Eat a healthy diet. Foods like vegetables, fruits, whole grains, low-fat dairy products, and lean protein foods contain the nutrients you need without too many calories. Decrease your intake of foods high in solid fats, added sugars, and salt. Get information about a proper diet from your caregiver, if necessary.   Regular physical exercise is one of the most important things you can do for your health. Most adults should get at least 150 minutes of moderate-intensity exercise (any activity that increases your heart rate and causes you to sweat) each week. In addition, most adults need muscle-strengthening exercises on 2 or more days a week.    Maintain a healthy weight. The body mass index (BMI) is a screening tool to identify possible weight problems. It provides an estimate of body fat based on height and weight. Your caregiver can help determine your BMI, and can help you achieve or maintain a healthy weight. For adults 20 years and older:   A BMI below 18.5 is considered underweight.   A BMI of 18.5 to 24.9 is normal.   A BMI of 25 to 29.9 is considered overweight.   A BMI of 30 and above is considered obese.   Maintain normal blood lipids and cholesterol by exercising and minimizing your intake of saturated fat. Eat a balanced diet with plenty of fruits and vegetables. Blood tests for lipids and cholesterol should begin at age 20 and be repeated every 5 years. If your lipid or cholesterol levels are high, you are over 50, or you are a high risk for heart disease, you may need your cholesterol levels checked more frequently.Ongoing high lipid and cholesterol levels should be treated with medicines, if diet and exercise are not effective.   If you smoke, find out from your caregiver how to quit. If you do not use tobacco, do not start.    If you choose to drink alcohol, do not exceed 2 drinks per day. One drink is considered to be 12 ounces (355 mL) of beer, 5 ounces (148 mL) of wine, or 1.5 ounces (44 mL) of liquor.   Avoid use of street drugs. Do not share needles with anyone. Ask for help if you need support or instructions about stopping the use of drugs.   High blood pressure causes heart disease and increases the risk of stroke. Blood pressure should be checked at least every 1 to 2 years. Ongoing high blood pressure should be treated with medicines if weight loss and exercise are not effective.   If you are 45 to 56 years old, ask your caregiver if you should take aspirin to prevent heart disease.   Diabetes screening involves taking a blood sample to check your fasting blood sugar level. This should be done once every 3 years, after age 45, if you are within normal weight and without risk factors for diabetes. Testing should be considered at a younger age or be carried out more frequently if you are overweight and have at least 1 risk factor for diabetes.   Colorectal cancer can be detected and often prevented. Most routine colorectal cancer screening begins at the age of 50 and continues through age 75. However, your caregiver may recommend screening at an earlier age if you have risk factors for colon cancer. On a yearly basis, your caregiver may provide home test kits to check for hidden   blood in the stool. Use of a small camera at the end of a tube, to directly examine the colon (sigmoidoscopy or colonoscopy), can detect the earliest forms of colorectal cancer. Talk to your caregiver about this at age 50, when routine screening begins. Direct examination of the colon should be repeated every 5 to 10 years through age 75, unless early forms of pre-cancerous polyps or small growths are found.   Hepatitis C blood testing is recommended for all people born from 1945 through 1965 and any individual with known risks for  hepatitis C.   Healthy men should no longer receive prostate-specific antigen (PSA) blood tests as part of routine cancer screening. Consult with your caregiver about prostate cancer screening.   Testicular cancer screening is not recommended for adolescents or adult males who have no symptoms. Screening includes self-exam, caregiver exam, and other screening tests. Consult with your caregiver about any symptoms you have or any concerns you have about testicular cancer.   Practice safe sex. Use condoms and avoid high-risk sexual practices to reduce the spread of sexually transmitted infections (STIs).   Use sunscreen with a sun protection factor (SPF) of 30 or greater. Apply sunscreen liberally and repeatedly throughout the day. You should seek shade when your shadow is shorter than you. Protect yourself by wearing long sleeves, pants, a wide-brimmed hat, and sunglasses year round, whenever you are outdoors.   Notify your caregiver of new moles or changes in moles, especially if there is a change in shape or color. Also notify your caregiver if a mole is larger than the size of a pencil eraser.   A one-time screening for abdominal aortic aneurysm (AAA) and surgical repair of large AAAs by sound wave imaging (ultrasonography) is recommended for ages 65 to 75 years who are current or former smokers.   Stay current with your immunizations.  Document Released: 07/29/2007 Document Revised: 01/19/2011 Document Reviewed: 06/27/2010 [employer] Patient Information 2012 [employer]. 

## 2011-09-20 NOTE — Assessment & Plan Note (Signed)
He is doing well on simvastatin, I will check his FLP and CMP today

## 2011-09-20 NOTE — Progress Notes (Signed)
Subjective:    Patient ID: Calvin Burke, male    DOB: 25-Jul-1955, 56 y.o.   MRN: [id number]  Gastrophageal Reflux He complains of heartburn and a hoarse voice (some throat clearing). He reports no abdominal pain, no belching, no chest pain, no choking, no coughing, no dysphagia, no early satiety, no globus sensation, no nausea, no sore throat, no stridor, no tooth decay, no water brash or no wheezing. This is a chronic problem. The current episode started more than 1 year ago. The problem occurs occasionally. The problem has been unchanged. The heartburn duration is less than a minute. The heartburn is located in the substernum. The heartburn is of mild intensity. The heartburn does not wake him from sleep. The heartburn does not limit his activity. The heartburn doesn't change with position. Nothing aggravates the symptoms. Pertinent negatives include no fatigue, muscle weakness or weight loss. Risk factors include no known risk factors. He has tried a PPI (prilosec 20 mg) for the symptoms. The treatment provided moderate relief.      Review of Systems  Constitutional: Negative for fever, chills, weight loss, diaphoresis, activity change, appetite change, fatigue and unexpected weight change.  HENT: Positive for hoarse voice (some throat clearing). Negative for sore throat.   Eyes: Negative.   Respiratory: Negative for cough, choking, chest tightness, shortness of breath, wheezing and stridor.   Cardiovascular: Negative for chest pain, palpitations and leg swelling.  Gastrointestinal: Positive for heartburn. Negative for dysphagia, nausea, vomiting, abdominal pain, diarrhea, constipation, blood in stool, abdominal distention, anal bleeding and rectal pain.  Genitourinary: Negative.   Musculoskeletal: Negative for myalgias, back pain, joint swelling, arthralgias, gait problem and muscle weakness.  Skin: Negative for color change, pallor, rash and wound.  Neurological: Negative.     Hematological: Negative for adenopathy. Does not bruise/bleed easily.  Psychiatric/Behavioral: Negative.        Objective:   Physical Exam  Vitals reviewed. Constitutional: He is oriented to person, place, and time. He appears well-developed and well-nourished. No distress.  HENT:  Head: Normocephalic and atraumatic.  Mouth/Throat: Oropharynx is clear and moist. No oropharyngeal exudate.  Eyes: Conjunctivae are normal. Right eye exhibits no discharge. Left eye exhibits no discharge. No scleral icterus.  Neck: Normal range of motion. Neck supple. No JVD present. No tracheal deviation present. No thyromegaly present.  Cardiovascular: Normal rate, regular rhythm, normal heart sounds and intact distal pulses.  Exam reveals no gallop and no friction rub.   No murmur heard. Pulmonary/Chest: Effort normal and breath sounds normal. No stridor. No respiratory distress. He has no wheezes. He has no rales. He exhibits no tenderness.  Abdominal: Soft. Bowel sounds are normal. He exhibits no distension and no mass. There is no tenderness. There is no rebound and no guarding. Hernia confirmed negative in the right inguinal area and confirmed negative in the left inguinal area.  Genitourinary: Rectum normal, prostate normal and penis normal. Rectal exam shows no external hemorrhoid, no internal hemorrhoid, no fissure, no mass, no tenderness and anal tone normal. Guaiac negative stool. Prostate is not enlarged and not tender. Right testis shows no mass, no swelling and no tenderness. Right testis is descended. Circumcised. No penile tenderness. No discharge found.       Left hemiscrotum is empty  Musculoskeletal: Normal range of motion. He exhibits no edema and no tenderness.  Lymphadenopathy:    He has no cervical adenopathy.       Right: No inguinal adenopathy present.  Left: No inguinal adenopathy present.  Neurological: He is oriented to person, place, and time.  Skin: Skin is warm and dry. No  rash noted. He is not diaphoretic. No erythema. No pallor.  Psychiatric: He has a normal mood and affect. His behavior is normal. Judgment and thought content normal.      Lab Results  Component Value Date   WBC 4.7 03/08/2011   HGB 14.1 03/08/2011   HCT 39.2 03/08/2011   PLT 176 03/08/2011   GLUCOSE 86 03/08/2011   CHOL 190 10/21/2009   TRIG 148.0 10/21/2009   HDL 39.20 10/21/2009   LDLDIRECT 138.2 10/09/2007   LDLCALC 121* 10/21/2009   ALT 23 03/06/2011   AST 18 03/06/2011   NA 139 03/08/2011   K 3.5 03/08/2011   CL 105 03/08/2011   CREATININE 0.85 03/08/2011   BUN 13 03/08/2011   CO2 28 03/08/2011   TSH 0.550 03/07/2011   PSA 0.19 10/21/2009   HGBA1C 5.3 03/07/2011      Assessment & Plan:

## 2011-09-20 NOTE — Assessment & Plan Note (Signed)
Exam done, labs ordered, vaccines were reviewed, pt ed material was given 

## 2011-09-20 NOTE — Assessment & Plan Note (Signed)
Will increase the prilosec dose

## 2012-06-17 ENCOUNTER — Other Ambulatory Visit (HOSPITAL_COMMUNITY): Payer: Self-pay

## 2012-06-17 ENCOUNTER — Encounter (HOSPITAL_COMMUNITY): Payer: BC Managed Care – PPO | Attending: Oncology | Admitting: Oncology

## 2012-06-17 ENCOUNTER — Encounter (HOSPITAL_COMMUNITY): Payer: Self-pay | Admitting: Oncology

## 2012-06-17 VITALS — BP 113/69 | HR 73 | Temp 98.3°F | Resp 16 | Ht 73.25 in | Wt 190.6 lb

## 2012-06-17 DIAGNOSIS — Z09 Encounter for follow-up examination after completed treatment for conditions other than malignant neoplasm: Secondary | ICD-10-CM | POA: Insufficient documentation

## 2012-06-17 DIAGNOSIS — C6292 Malignant neoplasm of left testis, unspecified whether descended or undescended: Secondary | ICD-10-CM

## 2012-06-17 DIAGNOSIS — E7800 Pure hypercholesterolemia, unspecified: Secondary | ICD-10-CM | POA: Insufficient documentation

## 2012-06-17 DIAGNOSIS — E785 Hyperlipidemia, unspecified: Secondary | ICD-10-CM

## 2012-06-17 DIAGNOSIS — Z8547 Personal history of malignant neoplasm of testis: Secondary | ICD-10-CM

## 2012-06-17 DIAGNOSIS — K219 Gastro-esophageal reflux disease without esophagitis: Secondary | ICD-10-CM

## 2012-06-17 LAB — CBC WITH DIFFERENTIAL/PLATELET
Eosinophils Absolute: 0.1 10*3/uL (ref 0.0–0.7)
Eosinophils Relative: 2 % (ref 0–5)
Lymphs Abs: 1.6 10*3/uL (ref 0.7–4.0)
MCH: 31.9 pg (ref 26.0–34.0)
MCV: 88 fL (ref 78.0–100.0)
Monocytes Relative: 10 % (ref 3–12)
Platelets: 215 10*3/uL (ref 150–400)
RBC: 5.27 MIL/uL (ref 4.22–5.81)

## 2012-06-17 LAB — TSH: TSH: 0.972 u[IU]/mL (ref 0.350–4.500)

## 2012-06-17 LAB — COMPREHENSIVE METABOLIC PANEL
BUN: 19 mg/dL (ref 6–23)
Calcium: 9.9 mg/dL (ref 8.4–10.5)
Creatinine, Ser: 0.99 mg/dL (ref 0.50–1.35)
GFR calc Af Amer: >90 mL/min (ref >90)
Glucose, Bld: 80 mg/dL (ref 70–99)
Sodium: 139 mEq/L (ref 135–145)
Total Protein: 7.7 g/dL (ref 6.0–8.3)

## 2012-06-17 LAB — AFP TUMOR MARKER: AFP-Tumor Marker: 2.8 ng/mL (ref 0.0–8.0)

## 2012-06-17 LAB — LACTATE DEHYDROGENASE: LDH: 263 U/L — ABNORMAL HIGH (ref 94–250)
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
## 2012-06-17 NOTE — Patient Instructions (Addendum)
[facility] Discharge Instructions  RECOMMENDATIONS MADE BY THE CONSULTANT AND ANY TEST RESULTS WILL BE SENT TO YOUR REFERRING PHYSICIAN.  EXAM FINDINGS BY THE PHYSICIAN TODAY AND SIGNS OR SYMPTOMS TO REPORT TO CLINIC OR PRIMARY PHYSICIAN: Exam and discussion by MD.  Your are doing well.  Would like for you to try some aerobic exercises.  If you don't get the requested letter from Dr. Mariel Sleet by next Tuesday, call us. Tobie Lords, RN [phone]).  MEDICATIONS PRESCRIBED:  none  INSTRUCTIONS GIVEN AND DISCUSSED: Report any new lumps, bone pain or shortness of breath.  SPECIAL INSTRUCTIONS/FOLLOW-UP: As needed.  Thank you for choosing [facility] to provide your oncology and hematology care.  To afford each patient quality time with our providers, please arrive at least 15 minutes before your scheduled appointment time.  With your help, our goal is to use those 15 minutes to complete the necessary work-up to ensure our physicians have the information they need to help with your evaluation and healthcare recommendations.    Effective January 1st, 2014, we ask that you re-schedule your appointment with our physicians should you arrive 10 or more minutes late for your appointment.  We strive to give you quality time with our providers, and arriving late affects you and other patients whose appointments are after yours.    Again, thank you for choosing [facility].  Our hope is that these requests will decrease the amount of time that you wait before being seen by our physicians.       _____________________________________________________________  Should you have questions after your visit to [facility], please contact our office at [phone] between the hours of 8:30 a.m. and 5:00 p.m.  Voicemails left after 4:30 p.m. will not be returned until the following business day.  For prescription refill requests, have your pharmacy  contact our office with your prescription refill request.

## 2012-06-20 LAB — BETA HCG QUANT (REF LAB): Beta hCG, Tumor Marker: 1.1 m[IU]/mL (ref <5.0)

## 2012-06-25 ENCOUNTER — Other Ambulatory Visit (HOSPITAL_COMMUNITY): Payer: Self-pay

## 2012-06-25 ENCOUNTER — Telehealth (HOSPITAL_COMMUNITY): Payer: Self-pay

## 2012-06-25 DIAGNOSIS — E876 Hypokalemia: Secondary | ICD-10-CM

## 2012-06-25 MED ORDER — POTASSIUM CHLORIDE CRYS ER 20 MEQ PO TBCR: EXTENDED_RELEASE_TABLET | ORAL | Status: DC

## 2012-06-25 NOTE — Telephone Encounter (Signed)
Error

## 2012-06-25 NOTE — Telephone Encounter (Addendum)
Patient notified and Kdur e-scribed to [facility] in [place].  Verbalized understanding of instructions.

## 2012-06-25 NOTE — Telephone Encounter (Deleted)
Message copied by Evelena Leyden on Tue Jun 25, 2012  5:58 PM ------      Message from: Mariel Sleet, ERIC S      Created: Tue Jun 25, 2012  5:52 PM       Call him Kdur #30 one a day til gone with bmet when he comes back for that repeat LDH ------

## 2012-06-28 ENCOUNTER — Telehealth (HOSPITAL_COMMUNITY): Payer: Self-pay

## 2012-06-28 NOTE — Telephone Encounter (Signed)
Patient notified that because LDH was elevated MD could not write letter stating that he was disease free.  If LDH is normal after being off simvastatin then letter could be done.  Verbalized understanding of instructions.

## 2012-07-15 ENCOUNTER — Encounter (HOSPITAL_COMMUNITY): Payer: Self-pay | Admitting: Oncology

## 2012-07-15 ENCOUNTER — Encounter (HOSPITAL_COMMUNITY): Payer: BC Managed Care – PPO | Attending: Oncology

## 2012-07-15 DIAGNOSIS — E876 Hypokalemia: Secondary | ICD-10-CM

## 2012-07-15 DIAGNOSIS — E785 Hyperlipidemia, unspecified: Secondary | ICD-10-CM | POA: Insufficient documentation

## 2012-07-15 DIAGNOSIS — Z8547 Personal history of malignant neoplasm of testis: Secondary | ICD-10-CM | POA: Insufficient documentation

## 2012-07-15 NOTE — Progress Notes (Signed)
Labs drawn today for potassium,ldh

## 2012-10-01 ENCOUNTER — Other Ambulatory Visit: Payer: Self-pay | Admitting: Internal Medicine

## 2014-09-30 ENCOUNTER — Encounter: Payer: Self-pay | Admitting: Internal Medicine

## 2014-09-30 ENCOUNTER — Other Ambulatory Visit (INDEPENDENT_AMBULATORY_CARE_PROVIDER_SITE_OTHER): Payer: BLUE CROSS/BLUE SHIELD

## 2014-09-30 ENCOUNTER — Ambulatory Visit (INDEPENDENT_AMBULATORY_CARE_PROVIDER_SITE_OTHER): Payer: BLUE CROSS/BLUE SHIELD | Admitting: Internal Medicine

## 2014-09-30 VITALS — BP 124/78 | HR 60 | Temp 97.5°F | Resp 16 | Ht 73.0 in | Wt 189.0 lb

## 2014-09-30 DIAGNOSIS — E785 Hyperlipidemia, unspecified: Secondary | ICD-10-CM | POA: Diagnosis not present

## 2014-09-30 DIAGNOSIS — Z0000 Encounter for general adult medical examination without abnormal findings: Secondary | ICD-10-CM | POA: Diagnosis not present

## 2014-09-30 LAB — COMPREHENSIVE METABOLIC PANEL
ALT: 18 U/L (ref 0–53)
AST: 19 U/L (ref 0–37)
Albumin: 4.4 g/dL (ref 3.5–5.2)
Alkaline Phosphatase: 63 U/L (ref 39–117)
BUN: 24 mg/dL — ABNORMAL HIGH (ref 6–23)
CALCIUM: 9.6 mg/dL (ref 8.4–10.5)
CHLORIDE: 104 meq/L (ref 96–112)
CO2: 28 meq/L (ref 19–32)
Creatinine, Ser: 1.05 mg/dL (ref 0.40–1.50)
GFR: 76.78 mL/min (ref >60.00)
Glucose, Bld: 90 mg/dL (ref 70–99)
Potassium: 3.6 mEq/L (ref 3.5–5.1)
Sodium: 141 mEq/L (ref 135–145)
Total Bilirubin: 0.8 mg/dL (ref 0.2–1.2)
Total Protein: 6.8 g/dL (ref 6.0–8.3)

## 2014-09-30 LAB — CBC WITH DIFFERENTIAL/PLATELET
BASOS ABS: 0 10*3/uL (ref 0.0–0.1)
BASOS PCT: 0.4 % (ref 0.0–3.0)
Eosinophils Absolute: 0.1 10*3/uL (ref 0.0–0.7)
Eosinophils Relative: 2.2 % (ref 0.0–5.0)
HEMATOCRIT: 49 % (ref 39.0–52.0)
HEMOGLOBIN: 17.1 g/dL — AB (ref 13.0–17.0)
LYMPHS PCT: 31.7 % (ref 12.0–46.0)
Lymphs Abs: 1.5 10*3/uL (ref 0.7–4.0)
MCHC: 35 g/dL (ref 30.0–36.0)
MCV: 91.9 fl (ref 78.0–100.0)
MONOS PCT: 13 % — AB (ref 3.0–12.0)
Monocytes Absolute: 0.6 10*3/uL (ref 0.1–1.0)
NEUTROS ABS: 2.4 10*3/uL (ref 1.4–7.7)
Neutrophils Relative %: 52.7 % (ref 43.0–77.0)
PLATELETS: 193 10*3/uL (ref 150.0–400.0)
RBC: 5.33 Mil/uL (ref 4.22–5.81)
RDW: 12.4 % (ref 11.5–15.5)
WBC: 4.6 10*3/uL (ref 4.0–10.5)

## 2014-09-30 LAB — LIPID PANEL
CHOL/HDL RATIO: 5
Cholesterol: 246 mg/dL — ABNORMAL HIGH (ref 0–200)
HDL: 48.6 mg/dL (ref >39.00)
LDL CALC: 180 mg/dL — AB (ref 0–99)
NonHDL: 197.25
TRIGLYCERIDES: 85 mg/dL (ref 0.0–149.0)
VLDL: 17 mg/dL (ref 0.0–40.0)

## 2014-09-30 LAB — PSA: PSA: 0.3 ng/mL (ref 0.10–4.00)

## 2014-09-30 LAB — FECAL OCCULT BLOOD, GUAIAC: Fecal Occult Blood: NEGATIVE

## 2014-09-30 LAB — TSH: TSH: 1.04 u[IU]/mL (ref 0.35–4.50)

## 2014-09-30 MED ORDER — ATORVASTATIN CALCIUM 40 MG PO TABS: 40.0000 mg | ORAL_TABLET | Freq: Every day | ORAL | Status: DC

## 2014-09-30 NOTE — Patient Instructions (Signed)

## 2014-09-30 NOTE — Progress Notes (Signed)
Subjective:  Patient ID: Calvin Burke, male    DOB: 1955-04-14  Age: 59 y.o. MRN: [id number]  CC: Annual Exam and Hyperlipidemia   HPI [name] presents for a physical and cholesterol check. He was previously treated with a statin but has been noncompliant with it over the years. He also has a history of gastroesophageal reflux disease but has not recently had any heartburn or trouble swallowing.  Outpatient Prescriptions Prior to Visit  Medication Sig Dispense Refill  . omeprazole (PRILOSEC) 40 MG capsule Take 1 capsule (40 mg total) by mouth daily. 90 capsule 3  . potassium chloride SA (K-DUR,KLOR-CON) 20 MEQ tablet Take 1 daily til gone 30 tablet 0  . simvastatin (ZOCOR) 40 MG tablet Take 1 tablet (40 mg total) by mouth every evening. 90 tablet 3  . vitamin C (ASCORBIC ACID) 500 MG tablet Take 1,000 mg by mouth daily.     No facility-administered medications prior to visit.    ROS Review of Systems  Constitutional: Negative.   HENT: Negative.   Eyes: Negative.   Respiratory: Negative.  Negative for cough, choking, chest tightness, shortness of breath and stridor.   Cardiovascular: Negative.  Negative for chest pain, palpitations and leg swelling.  Gastrointestinal: Negative.  Negative for nausea, vomiting, abdominal pain, diarrhea, constipation and blood in stool.  Endocrine: Negative.   Genitourinary: Negative.  Negative for urgency, scrotal swelling, difficulty urinating and testicular pain.  Musculoskeletal: Negative.  Negative for myalgias, back pain, joint swelling and arthralgias.  Skin: Negative.  Negative for rash.  Allergic/Immunologic: Negative.   Neurological: Negative.   Hematological: Negative.  Negative for adenopathy. Does not bruise/bleed easily.  Psychiatric/Behavioral: Negative.     Objective:  BP 124/78 mmHg  Pulse 60  Temp(Src) 97.5 F (36.4 C) (Oral)  Resp 16  Ht 6\' 1"  (1.854 m)  Wt 189 lb (85.73 kg)  BMI 24.94 kg/m2  SpO2 99%  BP  Readings from Last 3 Encounters:  09/30/14 124/78  06/17/12 113/69  09/20/11 128/84    Wt Readings from Last 3 Encounters:  09/30/14 189 lb (85.73 kg)  06/17/12 190 lb 9.6 oz (86.456 kg)  09/20/11 190 lb 4 oz (86.297 kg)    Physical Exam  Constitutional: He is oriented to person, place, and time. He appears well-developed and well-nourished. No distress.  HENT:  Nose: Nose normal.  Mouth/Throat: Oropharynx is clear and moist. No oropharyngeal exudate.  Eyes: Conjunctivae are normal. Right eye exhibits no discharge. Left eye exhibits no discharge. No scleral icterus.  Neck: Normal range of motion. Neck supple. No JVD present. No tracheal deviation present. No thyromegaly present.  Cardiovascular: Normal rate, regular rhythm, normal heart sounds and intact distal pulses.  Exam reveals no gallop and no friction rub.   No murmur heard. Pulmonary/Chest: Effort normal and breath sounds normal. No stridor. No respiratory distress. He has no wheezes. He has no rales. He exhibits no tenderness.  Abdominal: Soft. Bowel sounds are normal. He exhibits no distension and no mass. There is no tenderness. There is no rebound and no guarding. Hernia confirmed negative in the right inguinal area and confirmed negative in the left inguinal area.  Genitourinary: Rectum normal, prostate normal, testes normal and penis normal. Rectal exam shows no external hemorrhoid, no internal hemorrhoid, no fissure, no mass, no tenderness and anal tone normal. Guaiac negative stool. Prostate is not enlarged and not tender. Right testis shows no mass, no swelling and no tenderness. Right testis is descended. Left testis  shows no mass, no swelling and no tenderness. Circumcised. No penile erythema or penile tenderness. No discharge found.  Left hemiscrotum is empty  Musculoskeletal: Normal range of motion. He exhibits no edema or tenderness.  Lymphadenopathy:    He has no cervical adenopathy.       Right: No inguinal  adenopathy present.       Left: No inguinal adenopathy present.  Neurological: He is oriented to person, place, and time.  Skin: Skin is warm and dry. No rash noted. He is not diaphoretic. No erythema. No pallor.  Psychiatric: He has a normal mood and affect. His behavior is normal. Judgment and thought content normal.  Vitals reviewed.   Lab Results  Component Value Date   WBC 4.6 09/30/2014   HGB 17.1* 09/30/2014   HCT 49.0 09/30/2014   PLT 193.0 09/30/2014   GLUCOSE 90 09/30/2014   CHOL 246* 09/30/2014   TRIG 85.0 09/30/2014   HDL 48.60 09/30/2014   LDLDIRECT 153.5 09/20/2011   LDLCALC 180* 09/30/2014   ALT 18 09/30/2014   AST 19 09/30/2014   NA 141 09/30/2014   K 3.6 09/30/2014   CL 104 09/30/2014   CREATININE 1.05 09/30/2014   BUN 24* 09/30/2014   CO2 28 09/30/2014   TSH 1.04 09/30/2014   PSA 0.30 09/30/2014   HGBA1C 5.3 03/07/2011    Dg Abd 1 View  03/07/2011   *RADIOLOGY REPORT*  Clinical Data: Follow up small bowel obstruction pattern  ABDOMEN - 1 VIEW  Comparison: 03/06/2011  Findings: Enteric contrast material from 03/06/2011 has progressed into the proximal colon.  There are a few persistent dilated loops of small bowel which are not significantly changed from previous exam.  Surgical clips are seen scattered throughout the abdomen and pelvis.  IMPRESSION:  1.  No change in partial small bowel obstruction pattern.  Original Report Authenticated By: Angelita Ingles, M.D.  Ct Abdomen Pelvis W Contrast  03/06/2011   *RADIOLOGY REPORT*  Clinical Data: Mid abdominal pain, history of small bowel obstruction, testicular cancer with metastatic disease, history of nephrectomy.  CT ABDOMEN AND PELVIS WITH CONTRAST  Technique:  Multidetector CT imaging of the abdomen and pelvis was performed following the standard protocol during bolus administration of intravenous contrast.  Contrast: 119mL OMNIPAQUE IOHEXOL 300 MG/ML IV SOLN  Comparison: None.  Findings:  Normal hepatic  contour.  No discrete hepatic lesions.  Normal gallbladder.  No intra or extrahepatic biliary duct dilatation.  A small amount of fluid is noted adjacent to the inferior aspect of the right lobe of the liver (image 32, series 2).  Post left-sided nephrectomy.  There is no residual tissue within the left nephrectomy bed.  The remaining right kidney enhances and excretes normally.  No discrete right-sided renal lesions.  No urinary obstruction.  The bilateral adrenal glands are normal. Normal pancreas and spleen.  Incidental note is made of several small splenules.  The majority of ingested enteric contrast remains within the stomach.  There is mild dilatation of several loops of small bowel within the lower abdomen ( index loop measures 2.8 cm in diameter, image 72, series 2), however there is no definitive upstream and downstream dilatation to suggest obstruction.  Several of these dilated loops of small bowel contain mottled lucencies favored to represent stool which may suggest an element of stasis.  The appendix is not definitely identified, however there is no stranding within the right lower quadrant.  No pneumoperitoneum, pneumatosis or portal venous gas.  Normal caliber  abdominal aorta.  The major branch vessels of the abdominal aorta are all patent.  Post retroperitoneal and bilateral pelvic sidewall lymph node dissection without evidence of retroperitoneal, mesenteric, pelvic or inguinal lymphadenopathy.  Limited visualization of the lower thorax demonstrates bibasilar atelectasis.  No focal airspace opacities.  No pleural effusion. Normal heart size.  No pericardial effusion.  No acute or aggressive osseous abnormalities.  Lumbar spine degenerative change.  IMPRESSION: 1.  Mild dilatation of several loops of small bowel within the mid/lower pelvis without a discrete transition point to suggest obstruction.  Several of these loops of small bowel contain feces which may suggest a component of enteric stasis.   2.  Post left-sided nephrectomy and extensive retroperitoneal and pelvic lymphadenectomy without evidence of recurrent disease.  Original Report Authenticated By: Rachel Moulds, M.D.  Dg Abd Acute W/chest  03/06/2011   *RADIOLOGY REPORT*  Clinical Data: Epigastric pain with nausea vomiting.  ACUTE ABDOMEN SERIES (ABDOMEN 2 VIEW & CHEST 1 VIEW)  Comparison: None.  Findings: The lungs are clear without focal infiltrate, edema, pneumothorax or pleural effusion. Interstitial markings are diffusely coarsened with chronic features. The cardiopericardial silhouette is within normal limits for size. Imaged bony structures of the thorax are intact.  Upright film shows no evidence for intraperitoneal free air. Scattered small bowel air-fluid levels are seen on the upright film and a a dilated small bowel loop measuring up to 4.8 cm in diameter is seen in the left upper quadrant on the supine film.  There is stool is seen scattered along the course of the colon.  There are numerous surgical clips throughout the abdomen and pelvis.  IMPRESSION: No acute cardiopulmonary findings.  No intraperitoneal free air.  Mildly dilated small bowel loop with the scattered air-fluid levels in the small bowel.  The focal infectious or inflammatory ileus cannot be excluded. Small bowel obstruction is also a consideration.  CT imaging of the abdomen and pelvis may be helpful to further evaluate.  Original Report Authenticated By: ERIC A. MANSELL, M.D.   Assessment & Plan:   Jereld was seen today for annual exam and hyperlipidemia.  Diagnoses and all orders for this visit:  Routine general medical examination at a health care facility- exam done, labs ordered, vaccines reviewed, his colonoscopy is up-to-date, he was given patient education material. -     Lipid panel; Future -     Comprehensive metabolic panel; Future -     CBC with Differential/Platelet; Future -     PSA; Future -     TSH; Future  Hyperlipidemia with  target LDL less than 130- his Framingham risk score is 12%, therefore I have asked him to restart statin therapy -     atorvastatin (LIPITOR) 40 MG tablet; Take 1 tablet (40 mg total) by mouth daily.   I have discontinued Mr. Dombkowski vitamin C, omeprazole, simvastatin, and potassium chloride SA. I am also having him start on atorvastatin.  Meds ordered this encounter  Medications  . atorvastatin (LIPITOR) 40 MG tablet    Sig: Take 1 tablet (40 mg total) by mouth daily.    Dispense:  90 tablet    Refill:  3     Follow-up: Return in about 1 year (around 09/30/2015).  Scarlette Calico, MD

## 2014-10-02 ENCOUNTER — Telehealth: Payer: Self-pay | Admitting: Internal Medicine

## 2014-10-02 NOTE — Telephone Encounter (Signed)
Computer error. Pt did show. Fixed in system.

## 2014-10-02 NOTE — Telephone Encounter (Signed)
Patient no showed for cpe on 8/17.  Please advise.

## 2014-10-07 ENCOUNTER — Telehealth: Payer: Self-pay | Admitting: Internal Medicine

## 2014-10-07 NOTE — Telephone Encounter (Signed)
Pt received his lab results and it shows his cholesterol is high and he wants to talk with someone regarding them Please call patient

## 2014-10-08 NOTE — Telephone Encounter (Signed)
Pt advised. Will wait Dr. Ronnald Ramp response when he comes back from [place]

## 2014-10-08 NOTE — Telephone Encounter (Signed)
Returned pt call. Hx: Testicular cancer Pt last time he was on cholesterol medication (simvastatin) it made his cancer markers go up and that was 2 years ago. A Previous provider thought the cholesterol medication was the cause so he was taken off and further bloodwork was done and markers went back down. Pt wants to know is there an alternative to the medication besides watching diet? Dr. Ronnald Ramp prescribed Lipitor at last OV on 8/17 pt is just getting his result note with instructions for med due to recent labs. Please advise Dr. Ronnald Ramp out of office

## 2014-10-08 NOTE — Telephone Encounter (Signed)
Hold Lipitor for now. Check w/Dr Ronnald Ramp next week pls Thx

## 2014-10-12 NOTE — Telephone Encounter (Signed)
I think he should go ahead and try the new cholesterol medicine.

## 2014-10-13 NOTE — Telephone Encounter (Signed)
Pt said he was going to put some thought into. Wasn't sure if he was going to take it or not. Informed pt it was up to his decision.

## 2015-02-25 ENCOUNTER — Ambulatory Visit (INDEPENDENT_AMBULATORY_CARE_PROVIDER_SITE_OTHER): Payer: BLUE CROSS/BLUE SHIELD | Admitting: Internal Medicine

## 2015-02-25 ENCOUNTER — Other Ambulatory Visit (INDEPENDENT_AMBULATORY_CARE_PROVIDER_SITE_OTHER): Payer: BLUE CROSS/BLUE SHIELD

## 2015-02-25 ENCOUNTER — Encounter: Payer: Self-pay | Admitting: Internal Medicine

## 2015-02-25 VITALS — BP 130/82 | HR 72 | Temp 97.8°F | Resp 16 | Ht 73.0 in | Wt 193.0 lb

## 2015-02-25 DIAGNOSIS — K2100 Gastro-esophageal reflux disease with esophagitis, without bleeding: Secondary | ICD-10-CM

## 2015-02-25 DIAGNOSIS — Z23 Encounter for immunization: Secondary | ICD-10-CM | POA: Diagnosis not present

## 2015-02-25 DIAGNOSIS — Z0000 Encounter for general adult medical examination without abnormal findings: Secondary | ICD-10-CM

## 2015-02-25 DIAGNOSIS — R072 Precordial pain: Secondary | ICD-10-CM | POA: Diagnosis not present

## 2015-02-25 DIAGNOSIS — E785 Hyperlipidemia, unspecified: Secondary | ICD-10-CM

## 2015-02-25 LAB — LIPID PANEL
CHOL/HDL RATIO: 5
Cholesterol: 231 mg/dL — ABNORMAL HIGH (ref 0–200)
HDL: 51.1 mg/dL (ref >39.00)
LDL CALC: 164 mg/dL — AB (ref 0–99)
NONHDL: 179.94
TRIGLYCERIDES: 80 mg/dL (ref 0.0–149.0)
VLDL: 16 mg/dL (ref 0.0–40.0)

## 2015-02-25 LAB — COMPREHENSIVE METABOLIC PANEL
ALT: 20 U/L (ref 0–53)
AST: 21 U/L (ref 0–37)
Albumin: 4.6 g/dL (ref 3.5–5.2)
Alkaline Phosphatase: 64 U/L (ref 39–117)
BILIRUBIN TOTAL: 0.8 mg/dL (ref 0.2–1.2)
BUN: 22 mg/dL (ref 6–23)
CALCIUM: 10.1 mg/dL (ref 8.4–10.5)
CHLORIDE: 102 meq/L (ref 96–112)
CO2: 29 meq/L (ref 19–32)
CREATININE: 1.01 mg/dL (ref 0.40–1.50)
GFR: 80.19 mL/min (ref >60.00)
GLUCOSE: 92 mg/dL (ref 70–99)
Potassium: 4 mEq/L (ref 3.5–5.1)
SODIUM: 142 meq/L (ref 135–145)
Total Protein: 7.3 g/dL (ref 6.0–8.3)

## 2015-02-25 LAB — CARDIAC PANEL
CK MB: 2.2 ng/mL (ref 0.3–4.0)
Relative Index: 2.3 calc (ref 0.0–2.5)
Total CK: 96 U/L (ref 7–232)

## 2015-02-25 LAB — TROPONIN I: TNIDX: 0 ug/l (ref 0.00–0.06)

## 2015-02-25 LAB — TSH: TSH: 0.81 u[IU]/mL (ref 0.35–4.50)

## 2015-02-25 LAB — HIGH SENSITIVITY CRP: CRP HIGH SENSITIVITY: 0.65 mg/L (ref 0.000–5.000)

## 2015-02-25 MED ORDER — ASPIRIN 81 MG PO TABS: 81.0000 mg | ORAL_TABLET | Freq: Every day | ORAL | Status: DC

## 2015-02-25 MED ORDER — EZETIMIBE 10 MG PO TABS: 10.0000 mg | ORAL_TABLET | Freq: Every day | ORAL | Status: DC

## 2015-02-25 MED ORDER — OMEPRAZOLE 40 MG PO CPDR: 40.0000 mg | DELAYED_RELEASE_CAPSULE | Freq: Every day | ORAL | Status: DC

## 2015-02-25 NOTE — Patient Instructions (Signed)
Nonspecific Chest Pain  °Chest pain can be caused by many different conditions. There is always a chance that your pain could be related to something serious, such as a heart attack or a blood clot in your lungs. Chest pain can also be caused by conditions that are not life-threatening. If you have chest pain, it is very important to follow up with your health care provider. °CAUSES  °Chest pain can be caused by: °· Heartburn. °· Pneumonia or bronchitis. °· Anxiety or stress. °· Inflammation around your heart (pericarditis) or lung (pleuritis or pleurisy). °· A blood clot in your lung. °· A collapsed lung (pneumothorax). It can develop suddenly on its own (spontaneous pneumothorax) or from trauma to the chest. °· Shingles infection (varicella-zoster virus). °· Heart attack. °· Damage to the bones, muscles, and cartilage that make up your chest wall. This can include: °¨ Bruised bones due to injury. °¨ Strained muscles or cartilage due to frequent or repeated coughing or overwork. °¨ Fracture to one or more ribs. °¨ Sore cartilage due to inflammation (costochondritis). °RISK FACTORS  °Risk factors for chest pain may include: °· Activities that increase your risk for trauma or injury to your chest. °· Respiratory infections or conditions that cause frequent coughing. °· Medical conditions or overeating that can cause heartburn. °· Heart disease or family history of heart disease. °· Conditions or health behaviors that increase your risk of developing a blood clot. °· Having had chicken pox (varicella zoster). °SIGNS AND SYMPTOMS °Chest pain can feel like: °· Burning or tingling on the surface of your chest or deep in your chest. °· Crushing, pressure, aching, or squeezing pain. °· Dull or sharp pain that is worse when you move, cough, or take a deep breath. °· Pain that is also felt in your back, neck, shoulder, or arm, or pain that spreads to any of these areas. °Your chest pain may come and go, or it may stay  constant. °DIAGNOSIS °Lab tests or other studies may be needed to find the cause of your pain. Your health care provider may have you take a test called an ambulatory ECG (electrocardiogram). An ECG records your heartbeat patterns at the time the test is performed. You may also have other tests, such as: °· Transthoracic echocardiogram (TTE). During echocardiography, sound waves are used to create a picture of all of the heart structures and to look at how blood flows through your heart. °· Transesophageal echocardiogram (TEE). This is a more advanced imaging test that obtains images from inside your body. It allows your health care provider to see your heart in finer detail. °· Cardiac monitoring. This allows your health care provider to monitor your heart rate and rhythm in real time. °· Holter monitor. This is a portable device that records your heartbeat and can help to diagnose abnormal heartbeats. It allows your health care provider to track your heart activity for several days, if needed. °· Stress tests. These can be done through exercise or by taking medicine that makes your heart beat more quickly. °· Blood tests. °· Imaging tests. °TREATMENT  °Your treatment depends on what is causing your chest pain. Treatment may include: °· Medicines. These may include: °¨ Acid blockers for heartburn. °¨ Anti-inflammatory medicine. °¨ Pain medicine for inflammatory conditions. °¨ Antibiotic medicine, if an infection is present. °¨ Medicines to dissolve blood clots. °¨ Medicines to treat coronary artery disease. °· Supportive care for conditions that do not require medicines. This may include: °¨ Resting. °¨ Applying heat   or cold packs to injured areas. °¨ Limiting activities until pain decreases. °HOME CARE INSTRUCTIONS °· If you were prescribed an antibiotic medicine, finish it all even if you start to feel better. °· Avoid any activities that bring on chest pain. °· Do not use any tobacco products, including  cigarettes, chewing tobacco, or electronic cigarettes. If you need help quitting, ask your health care provider. °· Do not drink alcohol. °· Take medicines only as directed by your health care provider. °· Keep all follow-up visits as directed by your health care provider. This is important. This includes any further testing if your chest pain does not go away. °· If heartburn is the cause for your chest pain, you may be told to keep your head raised (elevated) while sleeping. This reduces the chance that acid will go from your stomach into your esophagus. °· Make lifestyle changes as directed by your health care provider. These may include: °¨ Getting regular exercise. Ask your health care provider to suggest some activities that are safe for you. °¨ Eating a heart-healthy diet. A registered dietitian can help you to learn healthy eating options. °¨ Maintaining a healthy weight. °¨ Managing diabetes, if necessary. °¨ Reducing stress. °SEEK MEDICAL CARE IF: °· Your chest pain does not go away after treatment. °· You have a rash with blisters on your chest. °· You have a fever. °SEEK IMMEDIATE MEDICAL CARE IF:  °· Your chest pain is worse. °· You have an increasing cough, or you cough up blood. °· You have severe abdominal pain. °· You have severe weakness. °· You faint. °· You have chills. °· You have sudden, unexplained chest discomfort. °· You have sudden, unexplained discomfort in your arms, back, neck, or jaw. °· You have shortness of breath at any time. °· You suddenly start to sweat, or your skin gets clammy. °· You feel nauseous or you vomit. °· You suddenly feel light-headed or dizzy. °· Your heart begins to beat quickly, or it feels like it is skipping beats. °These symptoms may represent a serious problem that is an emergency. Do not wait to see if the symptoms will go away. Get medical help right away. Call your local emergency services (911 in the [place] Do not drive yourself to the hospital. °  °This  information is not intended to replace advice given to you by your health care provider. Make sure you discuss any questions you have with your health care provider. °  °Document Released: 11/09/2004 Document Revised: 02/20/2014 Document Reviewed: 09/05/2013 °[employer] Interactive Patient Education ©2016 [employer] ° °

## 2015-02-25 NOTE — Progress Notes (Signed)
Subjective:  Patient ID: Calvin Burke, male    DOB: July 30, 1955  Age: 60 y.o. MRN: [id number]  CC: Hyperlipidemia and Chest Pain   HPI DARRENCE SIEGMAN presents for follow-up. He has stopped taking the statin medication because he was told by his oncologist that it raised his cancer markers. His only complaint today is of a several month history of intermittent chest pain that he describes as a dull and throbbing sensation over the left anterior aspect of his chest wall both on the upper side and the lower side. He also complains of heartburn and a brash taste in the back of his throat. He has recurrent episodes of throat clearing.  Outpatient Prescriptions Prior to Visit  Medication Sig Dispense Refill  . atorvastatin (LIPITOR) 40 MG tablet Take 1 tablet (40 mg total) by mouth daily. 90 tablet 3   No facility-administered medications prior to visit.    ROS Review of Systems  Constitutional: Negative.  Negative for fever, chills, diaphoresis, activity change, appetite change, fatigue and unexpected weight change.  HENT: Negative.  Negative for congestion, sore throat, trouble swallowing and voice change.   Eyes: Negative.   Respiratory: Negative.  Negative for cough, choking, chest tightness, shortness of breath, wheezing and stridor.   Cardiovascular: Positive for chest pain. Negative for palpitations and leg swelling.  Gastrointestinal: Negative.  Negative for nausea, vomiting, abdominal pain, diarrhea, constipation and blood in stool.  Endocrine: Negative.   Genitourinary: Negative.  Negative for dysuria and difficulty urinating.  Musculoskeletal: Negative.  Negative for myalgias, back pain, joint swelling, arthralgias and neck pain.  Skin: Negative.  Negative for color change and rash.  Allergic/Immunologic: Negative.   Neurological: Negative.  Negative for dizziness, tremors, weakness, light-headedness, numbness and headaches.  Hematological: Negative.  Negative for adenopathy.  Does not bruise/bleed easily.  Psychiatric/Behavioral: Negative.     Objective:  BP 130/82 mmHg  Pulse 72  Temp(Src) 97.8 F (36.6 C) (Oral)  Resp 16  Ht 6\' 1"  (1.854 m)  Wt 193 lb (87.544 kg)  BMI 25.47 kg/m2  SpO2 97%  BP Readings from Last 3 Encounters:  02/25/15 130/82  09/30/14 124/78  06/17/12 113/69    Wt Readings from Last 3 Encounters:  02/25/15 193 lb (87.544 kg)  09/30/14 189 lb (85.73 kg)  06/17/12 190 lb 9.6 oz (86.456 kg)    Physical Exam  Constitutional: He is oriented to person, place, and time. He appears well-developed and well-nourished. No distress.  HENT:  Head: Normocephalic and atraumatic.  Mouth/Throat: Oropharynx is clear and moist. No oropharyngeal exudate.  Eyes: Conjunctivae are normal. Right eye exhibits no discharge. Left eye exhibits no discharge. No scleral icterus.  Neck: Normal range of motion. Neck supple. No JVD present. No tracheal deviation present. No thyromegaly present.  Cardiovascular: Normal rate, normal heart sounds and intact distal pulses.  Exam reveals no gallop and no friction rub.   No murmur heard. EKG-  Normal sinus rhythm, no Q waves, no ST-T wave changes, slightly rightward axis which appears to be related to his body habitus, he has no history of pulmonary disease.  Pulmonary/Chest: Effort normal and breath sounds normal. No stridor. No respiratory distress. He has no wheezes. He has no rales. He exhibits no tenderness.  Abdominal: Soft. Bowel sounds are normal. He exhibits no distension and no mass. There is no tenderness. There is no rebound and no guarding.  Musculoskeletal: Normal range of motion. He exhibits no edema or tenderness.  Lymphadenopathy:  He has no cervical adenopathy.  Neurological: He is oriented to person, place, and time.  Skin: Skin is warm and dry. No rash noted. He is not diaphoretic. No erythema. No pallor.  Vitals reviewed.   Lab Results  Component Value Date   WBC 4.6 09/30/2014    HGB 17.1* 09/30/2014   HCT 49.0 09/30/2014   PLT 193.0 09/30/2014   GLUCOSE 92 02/25/2015   CHOL 231* 02/25/2015   TRIG 80.0 02/25/2015   HDL 51.10 02/25/2015   LDLDIRECT 153.5 09/20/2011   LDLCALC 164* 02/25/2015   ALT 20 02/25/2015   AST 21 02/25/2015   NA 142 02/25/2015   K 4.0 02/25/2015   CL 102 02/25/2015   CREATININE 1.01 02/25/2015   BUN 22 02/25/2015   CO2 29 02/25/2015   TSH 0.81 02/25/2015   PSA 0.30 09/30/2014   HGBA1C 5.3 03/07/2011    Dg Abd 1 View  03/07/2011  *RADIOLOGY REPORT* Clinical Data: Follow up small bowel obstruction pattern ABDOMEN - 1 VIEW Comparison: 03/06/2011 Findings: Enteric contrast material from 03/06/2011 has progressed into the proximal colon. There are a few persistent dilated loops of small bowel which are not significantly changed from previous exam. Surgical clips are seen scattered throughout the abdomen and pelvis. IMPRESSION: 1.  No change in partial small bowel obstruction pattern. Original Report Authenticated By: Angelita Ingles, M.D.  Ct Abdomen Pelvis W Contrast  03/06/2011  *RADIOLOGY REPORT* Clinical Data: Mid abdominal pain, history of small bowel obstruction, testicular cancer with metastatic disease, history of nephrectomy. CT ABDOMEN AND PELVIS WITH CONTRAST Technique:  Multidetector CT imaging of the abdomen and pelvis was performed following the standard protocol during bolus administration of intravenous contrast. Contrast: 155mL OMNIPAQUE IOHEXOL 300 MG/ML IV SOLN Comparison: None. Findings: Normal hepatic contour.  No discrete hepatic lesions.  Normal gallbladder.  No intra or extrahepatic biliary duct dilatation.  A small amount of fluid is noted adjacent to the inferior aspect of the right lobe of the liver (image 32, series 2). Post left-sided nephrectomy.  There is no residual tissue within the left nephrectomy bed.  The remaining right kidney enhances and excretes normally.  No discrete right-sided renal lesions.  No urinary  obstruction.  The bilateral adrenal glands are normal. Normal pancreas and spleen.  Incidental note is made of several small splenules. The majority of ingested enteric contrast remains within the stomach.  There is mild dilatation of several loops of small bowel within the lower abdomen ( index loop measures 2.8 cm in diameter, image 72, series 2), however there is no definitive upstream and downstream dilatation to suggest obstruction.  Several of these dilated loops of small bowel contain mottled lucencies favored to represent stool which may suggest an element of stasis.  The appendix is not definitely identified, however there is no stranding within the right lower quadrant.  No pneumoperitoneum, pneumatosis or portal venous gas. Normal caliber abdominal aorta.  The major branch vessels of the abdominal aorta are all patent.  Post retroperitoneal and bilateral pelvic sidewall lymph node dissection without evidence of retroperitoneal, mesenteric, pelvic or inguinal lymphadenopathy. Limited visualization of the lower thorax demonstrates bibasilar atelectasis.  No focal airspace opacities.  No pleural effusion. Normal heart size.  No pericardial effusion. No acute or aggressive osseous abnormalities.  Lumbar spine degenerative change. IMPRESSION: 1.  Mild dilatation of several loops of small bowel within the mid/lower pelvis without a discrete transition point to suggest obstruction.  Several of these loops of small bowel contain  feces which may suggest a component of enteric stasis. 2.  Post left-sided nephrectomy and extensive retroperitoneal and pelvic lymphadenectomy without evidence of recurrent disease. Original Report Authenticated By: Rachel Moulds, M.D.  Dg Abd Acute W/chest  03/06/2011  *RADIOLOGY REPORT* Clinical Data: Epigastric pain with nausea vomiting. ACUTE ABDOMEN SERIES (ABDOMEN 2 VIEW & CHEST 1 VIEW) Comparison: None. Findings: The lungs are clear without focal infiltrate, edema,  pneumothorax or pleural effusion. Interstitial markings are diffusely coarsened with chronic features. The cardiopericardial silhouette is within normal limits for size. Imaged bony structures of the thorax are intact. Upright film shows no evidence for intraperitoneal free air. Scattered small bowel air-fluid levels are seen on the upright film and a a dilated small bowel loop measuring up to 4.8 cm in diameter is seen in the left upper quadrant on the supine film.  There is stool is seen scattered along the course of the colon.  There are numerous surgical clips throughout the abdomen and pelvis. IMPRESSION: No acute cardiopulmonary findings. No intraperitoneal free air. Mildly dilated small bowel loop with the scattered air-fluid levels in the small bowel.  The focal infectious or inflammatory ileus cannot be excluded. Small bowel obstruction is also a consideration.  CT imaging of the abdomen and pelvis may be helpful to further evaluate. Original Report Authenticated By: ERIC A. MANSELL, M.D.   Assessment & Plan:   Ripken was seen today for hyperlipidemia and chest pain.  Diagnoses and all orders for this visit:  Routine general medical examination at a health care facility -     HIV antibody; Future -     Hepatitis C antibody; Future  Need for Tdap vaccination -     Tdap vaccine greater than or equal to 7yo IM  Precordial pain- he has atypical chest pain with a benign-appearing EKG, his highly sensitive CRP and cardiac enzymes are normal, I've asked him to undergo an exercise treadmill test to screen for coronary artery disease. -     EKG 12-Lead -     High sensitivity CRP; Future -     Troponin I; Future -     Cardiac panel; Future -     Exercise Tolerance Test; Future  Gastroesophageal reflux disease with esophagitis- this may be causing his chest pain so I have asked him to start a proton prop inhibitor. -     omeprazole (PRILOSEC) 40 MG capsule; Take 1 capsule (40 mg total) by  mouth daily.  Hyperlipidemia with target LDL less than 130- his Framingham risk score is about 10%, he is not willing to start a statin, he is willing to try something that's not in the statin family so will give Zetia try. -     High sensitivity CRP; Future -     TSH; Future -     Lipid panel; Future -     Comprehensive metabolic panel; Future -     aspirin 81 MG tablet; Take 1 tablet (81 mg total) by mouth daily. -     ezetimibe (ZETIA) 10 MG tablet; Take 1 tablet (10 mg total) by mouth daily.  I have discontinued Mr. Pianka atorvastatin. I am also having him start on omeprazole, aspirin, and ezetimibe.  Meds ordered this encounter  Medications  . omeprazole (PRILOSEC) 40 MG capsule    Sig: Take 1 capsule (40 mg total) by mouth daily.    Dispense:  90 capsule    Refill:  3  . aspirin 81 MG tablet  Sig: Take 1 tablet (81 mg total) by mouth daily.    Dispense:  90 tablet    Refill:  3  . ezetimibe (ZETIA) 10 MG tablet    Sig: Take 1 tablet (10 mg total) by mouth daily.    Dispense:  90 tablet    Refill:  3     Follow-up: Return in about 6 weeks (around 04/08/2015).  Scarlette Calico, MD

## 2015-02-25 NOTE — Progress Notes (Signed)
Pre visit review using our clinic review tool, if applicable. No additional management support is needed unless otherwise documented below in the visit note. 

## 2015-02-26 LAB — HEPATITIS C ANTIBODY: HCV Ab: NEGATIVE

## 2015-02-26 LAB — HIV ANTIBODY (ROUTINE TESTING W REFLEX): HIV 1&2 Ab, 4th Generation: NONREACTIVE

## 2015-02-28 ENCOUNTER — Encounter: Payer: Self-pay | Admitting: Internal Medicine

## 2015-03-05 ENCOUNTER — Telehealth: Payer: Self-pay | Admitting: Internal Medicine

## 2015-03-05 NOTE — Telephone Encounter (Signed)
Pt informed

## 2015-03-05 NOTE — Telephone Encounter (Signed)
Pt called regarding his labs.

## 2015-03-09 ENCOUNTER — Telehealth (HOSPITAL_COMMUNITY): Payer: Self-pay

## 2015-03-09 NOTE — Telephone Encounter (Signed)
Encounter complete. 

## 2015-03-11 ENCOUNTER — Ambulatory Visit (HOSPITAL_COMMUNITY)
Admission: RE | Admit: 2015-03-11 | Discharge: 2015-03-11 | Disposition: A | Discharge status: Home / Self Care | Payer: BLUE CROSS/BLUE SHIELD | Source: Ambulatory Visit | Attending: Internal Medicine | Admitting: Internal Medicine

## 2015-03-11 DIAGNOSIS — R072 Precordial pain: Secondary | ICD-10-CM | POA: Insufficient documentation

## 2015-03-12 LAB — EXERCISE TOLERANCE TEST
CHL RATE OF PERCEIVED EXERTION: 15
CSEPED: 9 min
CSEPEW: 10.1 METS
CSEPHR: 112 %
MPHR: 161 {beats}/min
Peak HR: 181 {beats}/min
Rest HR: 95 {beats}/min

## 2015-04-14 ENCOUNTER — Encounter (HOSPITAL_COMMUNITY): Payer: Self-pay | Admitting: Emergency Medicine

## 2015-04-14 ENCOUNTER — Emergency Department (HOSPITAL_COMMUNITY)
Admission: EM | Admit: 2015-04-14 | Discharge: 2015-04-14 | Disposition: A | Discharge status: Home / Self Care | Payer: BLUE CROSS/BLUE SHIELD | Source: Home / Self Care | Attending: Family Medicine | Admitting: Family Medicine

## 2015-04-14 DIAGNOSIS — B349 Viral infection, unspecified: Secondary | ICD-10-CM | POA: Diagnosis not present

## 2015-04-14 MED ORDER — HYDROCOD POLST-CPM POLST ER 10-8 MG/5ML PO SUER: 5.0000 mL | Freq: Two times a day (BID) | ORAL | Status: DC | PRN

## 2015-04-14 MED ORDER — IPRATROPIUM BROMIDE 0.06 % NA SOLN: 2.0000 | Freq: Four times a day (QID) | NASAL | Status: DC

## 2015-04-14 NOTE — Discharge Instructions (Signed)
Viral Infections Tussionex will cause drowsiness. Take as directed. Do not drive. Take Sudafed PE 10 mg every 4 hours for congestion Use saline nasal spray frequently Use the Atrovent nasal spray as needed for drainage Ibuprofen 600 mg every 6-8 hours as needed for headache and discomfort Drink plenty of fluids and stay well-hydrated For any worsening new symptoms or problems may return or follow-up with your primary care doctor. A viral infection can be caused by different types of viruses.Most viral infections are not serious and resolve on their own. However, some infections may cause severe symptoms and may lead to further complications. SYMPTOMS Viruses can frequently cause:  Minor sore throat.  Aches and pains.  Headaches.  Runny nose.  Different types of rashes.  Watery eyes.  Tiredness.  Cough.  Loss of appetite.  Gastrointestinal infections, resulting in nausea, vomiting, and diarrhea. These symptoms do not respond to antibiotics because the infection is not caused by bacteria. However, you might catch a bacterial infection following the viral infection. This is sometimes called a "superinfection." Symptoms of such a bacterial infection may include:  Worsening sore throat with pus and difficulty swallowing.  Swollen neck glands.  Chills and a high or persistent fever.  Severe headache.  Tenderness over the sinuses.  Persistent overall ill feeling (malaise), muscle aches, and tiredness (fatigue).  Persistent cough.  Yellow, green, or brown mucus production with coughing. HOME CARE INSTRUCTIONS   Only take over-the-counter or prescription medicines for pain, discomfort, diarrhea, or fever as directed by your caregiver.  Drink enough water and fluids to keep your urine clear or pale yellow. Sports drinks can provide valuable electrolytes, sugars, and hydration.  Get plenty of rest and maintain proper nutrition. Soups and broths with crackers or rice are  fine. SEEK IMMEDIATE MEDICAL CARE IF:   You have severe headaches, shortness of breath, chest pain, neck pain, or an unusual rash.  You have uncontrolled vomiting, diarrhea, or you are unable to keep down fluids.  You or your child has an oral temperature above 102 F (38.9 C), not controlled by medicine.  Your baby is older than 3 months with a rectal temperature of 102 F (38.9 C) or higher.  Your baby is 42 months old or younger with a rectal temperature of 100.4 F (38 C) or higher. MAKE SURE YOU:   Understand these instructions.  Will watch your condition.  Will get help right away if you are not doing well or get worse.   This information is not intended to replace advice given to you by your health care provider. Make sure you discuss any questions you have with your health care provider.   Document Released: 11/09/2004 Document Revised: 04/24/2011 Document Reviewed: 07/08/2014 Elsevier Interactive Patient Education [employer].

## 2015-04-14 NOTE — ED Notes (Signed)
Pt has been suffering from a cough and sinus congestion along with chills for nine days.  Pt is not sure if he has had a fever, but states he feels like "I have been run over by a [employer]."

## 2015-04-14 NOTE — ED Provider Notes (Signed)
CSN: [id number]     Arrival date & time 04/14/15  1525 History   First MD Initiated Contact with Patient 04/14/15 1659     Chief Complaint  Patient presents with  . URI   (Consider location/radiation/quality/duration/timing/severity/associated sxs/prior Treatment) HPI Comments: 60 year old male states that approximately 9 days ago he developed sinus congestion and a stuffy nose. That has been his chief complaint throughout the past 9 days. He has decreased energy, fatigue and malaise, difficulty sleeping, feeling achy and having a minor sore throat. He also has PND. Denies earache, chest pain, shortness of breath, abdominal pain, nausea, vomiting or diarrhea. Denies any known fevers at home. Does not have a history of smoking or asthma.   Past Medical History  Diagnosis Date  . Cancer [facility]) 1982    testicular  . Small bowel obstruction ([place])     x 1998 & 2012   Past Surgical History  Procedure Laterality Date  . Surgery scrotal / testicular  1982    for cancer  . Nephrectomy  1982    for cancer - left kidney removed   Family History  Problem Relation Age of Onset  . Alcohol abuse Mother   . Throat cancer Mother   . Cancer Father   . Cancer Sister   . Cancer Brother   . Diabetes Neg Hx   . Early death Neg Hx   . Heart disease Neg Hx   . Hyperlipidemia Neg Hx   . Hypertension Neg Hx   . Kidney disease Neg Hx   . Stroke Neg Hx    Social History  Substance Use Topics  . Smoking status: Never Smoker   . Smokeless tobacco: Never Used  . Alcohol Use: No     Comment: former use x20 years ago    Review of Systems  Constitutional: Positive for chills, activity change and appetite change. Negative for fever, diaphoresis and fatigue.  HENT: Positive for congestion, postnasal drip, rhinorrhea and sneezing. Negative for ear discharge, ear pain, facial swelling, sore throat and trouble swallowing.   Eyes: Negative for pain, discharge and redness.  Respiratory: Positive for cough.  Negative for chest tightness, shortness of breath and wheezing.   Cardiovascular: Negative.   Gastrointestinal: Negative.   Musculoskeletal: Negative.  Negative for neck pain and neck stiffness.  Skin: Negative.   Neurological: Negative.     Allergies  Bleomycin  Home Medications   Prior to Admission medications   Medication Sig Start Date End Date Taking? Authorizing Provider  omeprazole (PRILOSEC) 40 MG capsule Take 1 capsule (40 mg total) by mouth daily. 02/25/15  Yes Janith Lima, MD  aspirin 81 MG tablet Take 1 tablet (81 mg total) by mouth daily. 02/25/15   Janith Lima, MD  chlorpheniramine-HYDROcodone [facility] PENNKINETIC ER) 10-8 MG/5ML SUER Take 5 mLs by mouth every 12 (twelve) hours as needed for cough. 04/14/15   Janne Napoleon, NP  ezetimibe (ZETIA) 10 MG tablet Take 1 tablet (10 mg total) by mouth daily. 02/25/15   Janith Lima, MD  ipratropium (ATROVENT) 0.06 % nasal spray Place 2 sprays into both nostrils 4 (four) times daily. 04/14/15   Janne Napoleon, NP   Meds Ordered and Administered this Visit  Medications - No data to display  BP 143/78 mmHg  Pulse 82  Temp(Src) 99.2 F (37.3 C) (Oral)  Resp 18  SpO2 97% No data found.   Physical Exam  Constitutional: He is oriented to person, place, and time. He appears well-developed and  well-nourished. No distress.  HENT:  Mouth/Throat: No oropharyngeal exudate.  Bilateral TMs are normal Oropharynx with minor erythema. No swelling or exudates. Moderate amount of clear PND.  Eyes: Conjunctivae and EOM are normal.  Neck: Normal range of motion. Neck supple.  Cardiovascular: Normal rate, regular rhythm and normal heart sounds.   Pulmonary/Chest: Effort normal and breath sounds normal. No respiratory distress. He has no wheezes.  Musculoskeletal: Normal range of motion. He exhibits no edema.  Lymphadenopathy:    He has no cervical adenopathy.  Neurological: He is alert and oriented to person, place, and time.  Skin: Skin  is warm and dry. No rash noted.  Psychiatric: He has a normal mood and affect.  Nursing note and vitals reviewed.   ED Course  Procedures (including critical care time)  Labs Review Labs Reviewed - No data to display  Imaging Review No results found.   Visual Acuity Review  Right Eye Distance:   Left Eye Distance:   Bilateral Distance:    Right Eye Near:   Left Eye Near:    Bilateral Near:         MDM   1. Viral syndrome    Viral Infections Tussionex will cause drowsiness. Take as directed. Do not drive. Take Sudafed PE 10 mg every 4 hours for congestion Use saline nasal spray frequently Use the Atrovent nasal spray as needed for drainage Ibuprofen 600 mg every 6-8 hours as needed for headache and discomfort Drink plenty of fluids and stay well-hydrated For any worsening new symptoms or problems may return or follow-up with your primary care doctor. Meds ordered this encounter  Medications  . chlorpheniramine-HYDROcodone (TUSSIONEX PENNKINETIC ER) 10-8 MG/5ML SUER    Sig: Take 5 mLs by mouth every 12 (twelve) hours as needed for cough.    Dispense:  90 mL    Refill:  0    Order Specific Question:  Supervising Provider    Answer:  Billy Fischer [phone]  . ipratropium (ATROVENT) 0.06 % nasal spray    Sig: Place 2 sprays into both nostrils 4 (four) times daily.    Dispense:  15 mL    Refill:  12    Order Specific Question:  Supervising Provider    Answer:  Ihor Gully D [[id number]]      Janne Napoleon, NP 04/14/15 1723

## 2015-08-17 DIAGNOSIS — H33001 Unspecified retinal detachment with retinal break, right eye: Secondary | ICD-10-CM | POA: Insufficient documentation

## 2016-03-08 ENCOUNTER — Other Ambulatory Visit: Payer: Self-pay | Admitting: Internal Medicine

## 2016-03-24 ENCOUNTER — Encounter: Payer: Self-pay | Admitting: Gastroenterology

## 2016-04-06 ENCOUNTER — Other Ambulatory Visit: Payer: Self-pay | Admitting: Internal Medicine

## 2016-04-06 DIAGNOSIS — K2100 Gastro-esophageal reflux disease with esophagitis, without bleeding: Secondary | ICD-10-CM

## 2016-06-23 ENCOUNTER — Telehealth: Payer: Self-pay | Admitting: Internal Medicine

## 2016-06-23 NOTE — Telephone Encounter (Signed)
Patient states he will call back in regard. B/C he might be switching MD's.

## 2016-08-31 ENCOUNTER — Telehealth: Payer: Self-pay | Admitting: Internal Medicine

## 2016-08-31 NOTE — Telephone Encounter (Signed)
Pt would like to transfer from [place] to [place], [place] sees his father RANDELL TEARE DOB 11.[birth year].26

## 2016-08-31 NOTE — Telephone Encounter (Signed)
Yes, that is ok with me 

## 2016-08-31 NOTE — Telephone Encounter (Signed)
Ok Thx 

## 2016-09-01 NOTE — Telephone Encounter (Signed)
Cannot call out to inform pt because of phone issues

## 2016-10-25 ENCOUNTER — Ambulatory Visit (INDEPENDENT_AMBULATORY_CARE_PROVIDER_SITE_OTHER): Payer: BLUE CROSS/BLUE SHIELD | Admitting: Internal Medicine

## 2016-10-25 ENCOUNTER — Other Ambulatory Visit (INDEPENDENT_AMBULATORY_CARE_PROVIDER_SITE_OTHER): Payer: BLUE CROSS/BLUE SHIELD

## 2016-10-25 ENCOUNTER — Encounter: Payer: Self-pay | Admitting: Internal Medicine

## 2016-10-25 VITALS — BP 112/68 | HR 70 | Temp 98.6°F | Ht 73.0 in | Wt 193.0 lb

## 2016-10-25 DIAGNOSIS — Z8547 Personal history of malignant neoplasm of testis: Secondary | ICD-10-CM | POA: Diagnosis not present

## 2016-10-25 DIAGNOSIS — Z0000 Encounter for general adult medical examination without abnormal findings: Secondary | ICD-10-CM

## 2016-10-25 DIAGNOSIS — K2100 Gastro-esophageal reflux disease with esophagitis, without bleeding: Secondary | ICD-10-CM

## 2016-10-25 DIAGNOSIS — K219 Gastro-esophageal reflux disease without esophagitis: Secondary | ICD-10-CM

## 2016-10-25 DIAGNOSIS — E785 Hyperlipidemia, unspecified: Secondary | ICD-10-CM

## 2016-10-25 LAB — LIPID PANEL
Cholesterol: 250 mg/dL — ABNORMAL HIGH (ref 0–200)
HDL: 66.6 mg/dL (ref >39.00)
LDL CALC: 164 mg/dL — AB (ref 0–99)
NONHDL: 183.55
Total CHOL/HDL Ratio: 4
Triglycerides: 98 mg/dL (ref 0.0–149.0)
VLDL: 19.6 mg/dL (ref 0.0–40.0)

## 2016-10-25 LAB — URINALYSIS
Bilirubin Urine: NEGATIVE
Hgb urine dipstick: NEGATIVE
Ketones, ur: NEGATIVE
Leukocytes, UA: NEGATIVE
Nitrite: NEGATIVE
PH: 5.5 (ref 5.0–8.0)
TOTAL PROTEIN, URINE-UPE24: NEGATIVE
URINE GLUCOSE: NEGATIVE
Urobilinogen, UA: 0.2 (ref 0.0–1.0)

## 2016-10-25 LAB — TSH: TSH: 1.1 u[IU]/mL (ref 0.35–4.50)

## 2016-10-25 LAB — CBC WITH DIFFERENTIAL/PLATELET
BASOS PCT: 0.6 % (ref 0.0–3.0)
Basophils Absolute: 0 10*3/uL (ref 0.0–0.1)
EOS ABS: 0.1 10*3/uL (ref 0.0–0.7)
Eosinophils Relative: 2.3 % (ref 0.0–5.0)
HEMATOCRIT: 45.8 % (ref 39.0–52.0)
Hemoglobin: 15.9 g/dL (ref 13.0–17.0)
LYMPHS PCT: 25.6 % (ref 12.0–46.0)
Lymphs Abs: 1.6 10*3/uL (ref 0.7–4.0)
MCHC: 34.7 g/dL (ref 30.0–36.0)
MCV: 92.8 fl (ref 78.0–100.0)
MONOS PCT: 12.9 % — AB (ref 3.0–12.0)
Monocytes Absolute: 0.8 10*3/uL (ref 0.1–1.0)
NEUTROS ABS: 3.7 10*3/uL (ref 1.4–7.7)
Neutrophils Relative %: 58.6 % (ref 43.0–77.0)
PLATELETS: 217 10*3/uL (ref 150.0–400.0)
RBC: 4.93 Mil/uL (ref 4.22–5.81)
RDW: 12.6 % (ref 11.5–15.5)
WBC: 6.4 10*3/uL (ref 4.0–10.5)

## 2016-10-25 LAB — HEPATIC FUNCTION PANEL
ALBUMIN: 4.6 g/dL (ref 3.5–5.2)
ALK PHOS: 63 U/L (ref 39–117)
ALT: 20 U/L (ref 0–53)
AST: 19 U/L (ref 0–37)
BILIRUBIN DIRECT: 0.1 mg/dL (ref 0.0–0.3)
Total Bilirubin: 0.9 mg/dL (ref 0.2–1.2)
Total Protein: 7 g/dL (ref 6.0–8.3)

## 2016-10-25 LAB — BASIC METABOLIC PANEL
BUN: 23 mg/dL (ref 6–23)
CALCIUM: 9.7 mg/dL (ref 8.4–10.5)
CO2: 27 mEq/L (ref 19–32)
Chloride: 101 mEq/L (ref 96–112)
Creatinine, Ser: 1.03 mg/dL (ref 0.40–1.50)
GFR: 77.96 mL/min (ref >60.00)
Glucose, Bld: 87 mg/dL (ref 70–99)
Potassium: 3.6 mEq/L (ref 3.5–5.1)
SODIUM: 138 meq/L (ref 135–145)

## 2016-10-25 MED ORDER — VITAMIN D3 50 MCG (2000 UT) PO CAPS: 2000.0000 [IU] | ORAL_CAPSULE | Freq: Every day | ORAL | 3 refills | Status: AC

## 2016-10-25 NOTE — Patient Instructions (Signed)
Cologuard vs colonoscopy Shingrix vaccine

## 2016-10-25 NOTE — Assessment & Plan Note (Signed)
Labs

## 2016-10-25 NOTE — Assessment & Plan Note (Addendum)
We discussed age appropriate health related issues, including available/recomended screening tests and vaccinations. We discussed a need for adhering to healthy diet and exercise. Labs were ordered to be later reviewed . All questions were answered.  Colonoscopy vs cologuard Pt declined shots

## 2016-10-25 NOTE — Assessment & Plan Note (Signed)
Dr Abran Duke 1982 - orchyectomy L and L nephrectomy, chemo

## 2016-10-25 NOTE — Progress Notes (Signed)
Subjective:  Patient ID: Calvin Burke, male    DOB: July 24, 1955  Age: 61 y.o. MRN: [id number]  CC: No chief complaint on file.   HPI Calvin Burke presents for a well exam  Outpatient Medications Prior to Visit  Medication Sig Dispense Refill  . aspirin 81 MG EC tablet TAKE 1 TABLET (81 MG TOTAL) BY MOUTH DAILY. 90 tablet 0  . aspirin 81 MG tablet Take 1 tablet (81 mg total) by mouth daily. (Patient not taking: Reported on 10/25/2016) 90 tablet 3  . chlorpheniramine-HYDROcodone (TUSSIONEX PENNKINETIC ER) 10-8 MG/5ML SUER Take 5 mLs by mouth every 12 (twelve) hours as needed for cough. (Patient not taking: Reported on 10/25/2016) 90 mL 0  . ezetimibe (ZETIA) 10 MG tablet Take 1 tablet (10 mg total) by mouth daily. (Patient not taking: Reported on 10/25/2016) 90 tablet 3  . ipratropium (ATROVENT) 0.06 % nasal spray Place 2 sprays into both nostrils 4 (four) times daily. (Patient not taking: Reported on 10/25/2016) 15 mL 12  . omeprazole (PRILOSEC) 40 MG capsule TAKE 1 CAPSULE (40 MG TOTAL) BY MOUTH DAILY. (Patient not taking: Reported on 10/25/2016) 90 capsule 1   No facility-administered medications prior to visit.     ROS Review of Systems  Constitutional: Negative for appetite change, fatigue and unexpected weight change.  HENT: Negative for congestion, nosebleeds, sneezing, sore throat and trouble swallowing.   Eyes: Negative for itching and visual disturbance.  Respiratory: Negative for cough.   Cardiovascular: Negative for chest pain, palpitations and leg swelling.  Gastrointestinal: Negative for abdominal distention, blood in stool, diarrhea and nausea.  Genitourinary: Negative for frequency and hematuria.  Musculoskeletal: Negative for back pain, gait problem, joint swelling and neck pain.  Skin: Negative for rash.  Neurological: Negative for dizziness, tremors, speech difficulty and weakness.  Psychiatric/Behavioral: Negative for agitation, dysphoric mood and sleep  disturbance. The patient is not nervous/anxious.     Objective:  BP 112/68 (BP Location: Left Arm, Patient Position: Sitting, Cuff Size: Large)   Pulse 70   Temp 98.6 F (37 C) (Oral)   Ht 6\' 1"  (1.854 m)   Wt 193 lb (87.5 kg)   SpO2 98%   BMI 25.46 kg/m   BP Readings from Last 3 Encounters:  10/25/16 112/68  04/14/15 143/78  02/25/15 130/82    Wt Readings from Last 3 Encounters:  10/25/16 193 lb (87.5 kg)  02/25/15 193 lb (87.5 kg)  09/30/14 189 lb (85.7 kg)    Physical Exam  Constitutional: He is oriented to person, place, and time. He appears well-developed. No distress.  NAD  HENT:  Mouth/Throat: Oropharynx is clear and moist.  Eyes: Pupils are equal, round, and reactive to light. Conjunctivae are normal.  Neck: Normal range of motion. No JVD present. No thyromegaly present.  Cardiovascular: Normal rate, regular rhythm, normal heart sounds and intact distal pulses.  Exam reveals no gallop and no friction rub.   No murmur heard. Pulmonary/Chest: Effort normal and breath sounds normal. No respiratory distress. He has no wheezes. He has no rales. He exhibits no tenderness.  Abdominal: Soft. Bowel sounds are normal. He exhibits no distension and no mass. There is no tenderness. There is no rebound and no guarding.  Musculoskeletal: Normal range of motion. He exhibits no edema or tenderness.  Lymphadenopathy:    He has no cervical adenopathy.  Neurological: He is alert and oriented to person, place, and time. He has normal reflexes. No cranial nerve deficit. He exhibits normal muscle  tone. He displays a negative Romberg sign. Coordination and gait normal.  Skin: Skin is warm and dry. No rash noted.  Psychiatric: He has a normal mood and affect. His behavior is normal. Judgment and thought content normal.   R testis - no mass Prostate 1+  Lab Results  Component Value Date   WBC 4.6 09/30/2014   HGB 17.1 (H) 09/30/2014   HCT 49.0 09/30/2014   PLT 193.0 09/30/2014    GLUCOSE 92 02/25/2015   CHOL 231 (H) 02/25/2015   TRIG 80.0 02/25/2015   HDL 51.10 02/25/2015   LDLDIRECT 153.5 09/20/2011   LDLCALC 164 (H) 02/25/2015   ALT 20 02/25/2015   AST 21 02/25/2015   NA 142 02/25/2015   K 4.0 02/25/2015   CL 102 02/25/2015   CREATININE 1.01 02/25/2015   BUN 22 02/25/2015   CO2 29 02/25/2015   TSH 0.81 02/25/2015   PSA 0.30 09/30/2014   HGBA1C 5.3 03/07/2011    No results found.  Assessment & Plan:   There are no diagnoses linked to this encounter. I have discontinued Calvin Burke ezetimibe, chlorpheniramine-HYDROcodone, ipratropium, and omeprazole. I am also having him maintain his aspirin and Fish Oil.  Meds ordered this encounter  Medications  . Omega-3 Fatty Acids (FISH OIL) 1000 MG CAPS    Sig: Take by mouth.     Follow-up: No Follow-up on file.  Walker Kehr, MD

## 2016-10-30 ENCOUNTER — Telehealth: Payer: Self-pay | Admitting: Internal Medicine

## 2016-10-30 NOTE — Telephone Encounter (Signed)
Pt called in and said that he was suppose to call back and talk to you about a med.  He would not tell me any more then that.  Can you call him when you get a chance?

## 2016-10-30 NOTE — Telephone Encounter (Signed)
Called patient and he agreed to start the Lipitor. What strength would you like to send in?

## 2016-10-31 MED ORDER — ATORVASTATIN CALCIUM 10 MG PO TABS: 10.0000 mg | ORAL_TABLET | Freq: Every day | ORAL | 11 refills | Status: DC

## 2016-10-31 NOTE — Telephone Encounter (Signed)
Lipitor 10 mg emailed RTC 3 mo w/lipids and LFTs Thx

## 2016-12-04 DIAGNOSIS — Z1211 Encounter for screening for malignant neoplasm of colon: Secondary | ICD-10-CM | POA: Diagnosis not present

## 2016-12-04 DIAGNOSIS — Z1212 Encounter for screening for malignant neoplasm of rectum: Secondary | ICD-10-CM | POA: Diagnosis not present

## 2016-12-04 LAB — COLOGUARD: COLOGUARD: NEGATIVE

## 2016-12-21 ENCOUNTER — Encounter: Payer: Self-pay | Admitting: Internal Medicine

## 2017-01-23 DIAGNOSIS — H40023 Open angle with borderline findings, high risk, bilateral: Secondary | ICD-10-CM | POA: Diagnosis not present

## 2017-01-23 DIAGNOSIS — H40033 Anatomical narrow angle, bilateral: Secondary | ICD-10-CM | POA: Diagnosis not present

## 2017-01-23 DIAGNOSIS — H2513 Age-related nuclear cataract, bilateral: Secondary | ICD-10-CM | POA: Diagnosis not present

## 2017-01-23 DIAGNOSIS — T1501XA Foreign body in cornea, right eye, initial encounter: Secondary | ICD-10-CM | POA: Diagnosis not present

## 2017-03-20 ENCOUNTER — Ambulatory Visit: Payer: BLUE CROSS/BLUE SHIELD | Admitting: Internal Medicine

## 2017-03-20 ENCOUNTER — Encounter: Payer: Self-pay | Admitting: Internal Medicine

## 2017-03-20 DIAGNOSIS — M7070 Other bursitis of hip, unspecified hip: Secondary | ICD-10-CM | POA: Insufficient documentation

## 2017-03-20 DIAGNOSIS — M7062 Trochanteric bursitis, left hip: Secondary | ICD-10-CM | POA: Diagnosis not present

## 2017-03-20 MED ORDER — METHYLPREDNISOLONE ACETATE 80 MG/ML IJ SUSP
80.0000 mg | Freq: Once | INTRAMUSCULAR | Status: AC
  Administered 2017-03-20: 80 mg via INTRA_ARTICULAR

## 2017-03-20 MED ORDER — METHYLPREDNISOLONE ACETATE 80 MG/ML IJ SUSP: 80.0000 mg | Freq: Once | INTRAMUSCULAR | Status: DC

## 2017-03-20 NOTE — Progress Notes (Signed)
Subjective:  Patient ID: Calvin Burke, male    DOB: 07-25-1955  Age: 62 y.o. MRN: [id number]  CC: No chief complaint on file.   HPI Calvin Burke presents for L hip pain x 2 mo - getting worse   Outpatient Medications Prior to Visit  Medication Sig Dispense Refill  . aspirin 81 MG EC tablet TAKE 1 TABLET (81 MG TOTAL) BY MOUTH DAILY. 90 tablet 0  . Cholecalciferol (VITAMIN D3) 2000 units capsule Take 1 capsule (2,000 Units total) by mouth daily. 100 capsule 3  . Omega-3 Fatty Acids (FISH OIL) 1000 MG CAPS Take by mouth.    Marland Kitchen atorvastatin (LIPITOR) 10 MG tablet Take 1 tablet (10 mg total) by mouth daily. (Patient not taking: Reported on 03/20/2017) 30 tablet 11   No facility-administered medications prior to visit.     ROS Review of Systems  Constitutional: Negative for appetite change, fatigue and unexpected weight change.  HENT: Negative for congestion, nosebleeds, sneezing, sore throat and trouble swallowing.   Eyes: Negative for itching and visual disturbance.  Respiratory: Negative for cough.   Cardiovascular: Negative for chest pain, palpitations and leg swelling.  Gastrointestinal: Negative for abdominal distention, blood in stool, diarrhea and nausea.  Genitourinary: Negative for frequency and hematuria.  Musculoskeletal: Positive for gait problem. Negative for back pain, joint swelling and neck pain.  Skin: Negative for rash.  Neurological: Negative for dizziness, tremors, speech difficulty and weakness.  Psychiatric/Behavioral: Negative for agitation, dysphoric mood and sleep disturbance. The patient is not nervous/anxious.     Objective:  BP 122/72 (BP Location: Left Arm, Patient Position: Sitting, Cuff Size: Large)   Pulse 73   Temp 98.5 F (36.9 C) (Oral)   Ht 6\' 1"  (1.854 m)   Wt 198 lb (89.8 kg)   SpO2 98%   BMI 26.12 kg/m   BP Readings from Last 3 Encounters:  03/20/17 122/72  10/25/16 112/68  04/14/15 143/78    Wt Readings from Last 3  Encounters:  03/20/17 198 lb (89.8 kg)  10/25/16 193 lb (87.5 kg)  02/25/15 193 lb (87.5 kg)    Physical Exam  Constitutional: He is oriented to person, place, and time. He appears well-developed. No distress.  NAD  HENT:  Mouth/Throat: Oropharynx is clear and moist.  Eyes: Conjunctivae are normal. Pupils are equal, round, and reactive to light.  Neck: Normal range of motion. No JVD present. No thyromegaly present.  Cardiovascular: Normal rate, regular rhythm, normal heart sounds and intact distal pulses. Exam reveals no gallop and no friction rub.  No murmur heard. Pulmonary/Chest: Effort normal and breath sounds normal. No respiratory distress. He has no wheezes. He has no rales. He exhibits no tenderness.  Abdominal: Soft. Bowel sounds are normal. He exhibits no distension and no mass. There is no tenderness. There is no rebound and no guarding.  Musculoskeletal: Normal range of motion. He exhibits tenderness. He exhibits no edema.  Lymphadenopathy:    He has no cervical adenopathy.  Neurological: He is alert and oriented to person, place, and time. He has normal reflexes. No cranial nerve deficit. He exhibits normal muscle tone. He displays a negative Romberg sign. Coordination and gait normal.  Skin: Skin is warm and dry. No rash noted.  Psychiatric: He has a normal mood and affect. His behavior is normal. Judgment and thought content normal.  L hip is tender    Procedure Note :     Procedure : Joint Injection,  L  hip  Indication:  Trochanteric bursitis with refractory  chronic pain.   Risks including unsuccessful procedure , bleeding, infection, bruising, skin atrophy, "steroid flare-up" and others were explained to the patient in detail as well as the benefits. Informed consent was obtained and signed.   Tthe patient was placed in a comfortable lateral decubitus position. The point of maximal tenderness was identified. Skin was prepped with Betadine and alcohol. Then, a 5  cc syringe with a 2 inch long 24-gauge needle was used for a bursa injection.. The needle was advanced  Into the bursa. I injected the bursa with 4 mL of 2% lidocaine and 80 mg of Depo-Medrol .  Band-Aid was applied.   Tolerated well. Complications: None. Good pain relief following the procedure.   Postprocedure instructions :    A Band-Aid should be left on for 12 hours. Injection therapy is not a cure itself. It is used in conjunction with other modalities. You can use nonsteroidal anti-inflammatories like ibuprofen , hot and cold compresses. Rest is recommended in the next 24 hours. You need to report immediately  if fever, chills or any signs of infection develop.   Lab Results  Component Value Date   WBC 6.4 10/25/2016   HGB 15.9 10/25/2016   HCT 45.8 10/25/2016   PLT 217.0 10/25/2016   GLUCOSE 87 10/25/2016   CHOL 250 (H) 10/25/2016   TRIG 98.0 10/25/2016   HDL 66.60 10/25/2016   LDLDIRECT 153.5 09/20/2011   LDLCALC 164 (H) 10/25/2016   ALT 20 10/25/2016   AST 19 10/25/2016   NA 138 10/25/2016   K 3.6 10/25/2016   CL 101 10/25/2016   CREATININE 1.03 10/25/2016   BUN 23 10/25/2016   CO2 27 10/25/2016   TSH 1.10 10/25/2016   PSA 0.30 09/30/2014   HGBA1C 5.3 03/07/2011    No results found.  Assessment & Plan:   There are no diagnoses linked to this encounter. I am having Aerion L. Hermiz maintain his aspirin, Fish Oil, Vitamin D3, and atorvastatin.  No orders of the defined types were placed in this encounter.    Follow-up: No Follow-up on file.  Walker Kehr, MD

## 2017-03-20 NOTE — Assessment & Plan Note (Signed)
Will inject Tylenol prn Ice

## 2017-03-20 NOTE — Patient Instructions (Signed)
Trochanteric Bursitis Rehab Ask your health care provider which exercises are safe for you. Do exercises exactly as told by your health care provider and adjust them as directed. It is normal to feel mild stretching, pulling, tightness, or discomfort as you do these exercises, but you should stop right away if you feel sudden pain or your pain gets worse.Do not begin these exercises until told by your health care provider. Stretching exercises These exercises warm up your muscles and joints and improve the movement and flexibility of your hip. These exercises also help to relieve pain and stiffness. Exercise A: Iliotibial band stretch  1. Lie on your side with your left / right leg in the top position. 2. Bend your left / right knee and grab your ankle. 3. Slowly bring your knee back so your thigh is behind your body. 4. Slowly lower your knee toward the floor until you feel a gentle stretch on the outside of your left / right thigh. If you do not feel a stretch and your knee will not fall farther, place the heel of your other foot on top of your outer knee and pull your thigh down farther. 5. Hold this position for __________ seconds. 6. Slowly return to the starting position. Repeat __________ times. Complete this exercise __________ times a day. Strengthening exercises These exercises build strength and endurance in your hip and pelvis. Endurance is the ability to use your muscles for a long time, even after they get tired. Exercise B: Bridge ( hip extensors) 1. Lie on your back on a firm surface with your knees bent and your feet flat on the floor. 2. Tighten your buttocks muscles and lift your buttocks off the floor until your trunk is level with your thighs. You should feel the muscles working in your buttocks and the back of your thighs. If this exercise is too easy, try doing it with your arms crossed over your chest. 3. Hold this position for __________ seconds. 4. Slowly return to the  starting position. 5. Let your muscles relax completely between repetitions. Repeat __________ times. Complete this exercise __________ times a day. Exercise C: Squats ( knee extensors and  quadriceps) 1. Stand in front of a table, with your feet and knees pointing straight ahead. You may rest your hands on the table for balance but not for support. 2. Slowly bend your knees and lower your hips like you are going to sit in a chair. ? Keep your weight over your heels, not over your toes. ? Keep your lower legs upright so they are parallel with the table legs. ? Do not let your hips go lower than your knees. ? Do not bend lower than told by your health care provider. ? If your hip pain increases, do not bend as low. 3. Hold this position for __________ seconds. 4. Slowly push with your legs to return to standing. Do not use your hands to pull yourself to standing. Repeat __________ times. Complete this exercise __________ times a day. Exercise D: Hip hike 1. Stand sideways on a bottom step. Stand on your left / right leg with your other foot unsupported next to the step. You can hold onto the railing or wall if needed for balance. 2. Keeping your knees straight and your torso square, lift your left / right hip up toward the ceiling. 3. Hold this position for __________ seconds. 4. Slowly let your left / right hip lower toward the floor, past the starting position. Your foot   should get closer to the floor. Do not lean or bend your knees. Repeat __________ times. Complete this exercise __________ times a day. Exercise E: Single leg stand 1. Stand near a counter or door frame that you can hold onto for balance as needed. It is helpful to stand in front of a mirror for this exercise so you can watch your hip. 2. Squeeze your left / right buttock muscles then lift up your other foot. Do not let your left / right hip push out to the side. 3. Hold this position for __________ seconds. Repeat  __________ times. Complete this exercise __________ times a day. This information is not intended to replace advice given to you by your health care provider. Make sure you discuss any questions you have with your health care provider. Document Released: 03/09/2004 Document Revised: 10/07/2015 Document Reviewed: 01/15/2015 Elsevier Interactive Patient Education  2018 [employer] Trochanteric Bursitis Trochanteric bursitis is a condition that causes hip pain. Trochanteric bursitis happens when fluid-filled sacs (bursae) in the hip get irritated. Normally these sacs absorb shock and help strong bands of tissue (tendons) in your hip glide smoothly over each other and over your hip bones. What are the causes? This condition results from increased friction between the hip bones and the tendons that go over them. This condition can happen if you:  Have weak hips.  Use your hip muscles too much (overuse).  Get hit in the hip.  What increases the risk? This condition is more likely to develop in:  Women.  Adults who are middle-aged or older.  People with arthritis or a spinal condition.  People with weak buttocks muscles (gluteal muscles).  People who have one leg that is shorter than the other.  People who participate in certain kinds of athletic activities, such as: ? Running sports, especially long-distance running. ? Contact sports, like football or martial arts. ? Sports in which falls may occur, like skiing.  What are the signs or symptoms? The main symptom of this condition is pain and tenderness over the point of your hip. The pain may be:  Sharp and intense.  Dull and achy.  Felt on the outside of your thigh.  It may increase when you:  Lie on your side.  Walk or run.  Go up on stairs.  Sit.  Stand up after sitting.  Stand for long periods of time.  How is this diagnosed? This condition may be diagnosed based on:  Your symptoms.  Your medical  history.  A physical exam.  Imaging tests, such as: ? X-rays to check your bones. ? An MRI or ultrasound to check your tendons and muscles.  During your physical exam, your health care provider will check the movement and strength of your hip. He or she may press on the point of your hip to check for pain. How is this treated? This condition may be treated by:  Resting.  Reducing your activity.  Avoiding activities that cause pain.  Using crutches, a cane, or a walker to decrease the strain on your hip.  Taking medicine to help with swelling.  Having medicine injected into the bursae to help with swelling.  Using ice, heat, and massage therapy for pain relief.  Physical therapy exercises for strength and flexibility.  Surgery (rare).  Follow these instructions at home: Activity  Rest.  Avoid activities that cause pain.  Return to your normal activities as told by your health care provider. Ask your health care provider what activities   are safe for you. Managing pain, stiffness, and swelling  Take over-the-counter and prescription medicines only as told by your health care provider.  If directed, apply heat to the injured area as told by your health care provider. ? Place a towel between your skin and the heat source. ? Leave the heat on for 20-30 minutes. ? Remove the heat if your skin turns bright red. This is especially important if you are unable to feel pain, heat, or cold. You may have a greater risk of getting burned.  If directed, apply ice to the injured area: ? Put ice in a plastic bag. ? Place a towel between your skin and the bag. ? Leave the ice on for 20 minutes, 2-3 times a day. General instructions  If the affected leg is one that you use for driving, ask your health care provider when it is safe to drive.  Use crutches, a cane, or a walker as told by your health care provider.  If one of your legs is shorter than the other, get fitted for a  shoe insert.  Lose weight if you are overweight. How is this prevented?  Wear supportive footwear that is appropriate for your sport.  If you have hip pain, start any new exercise or sport slowly.  Maintain physical fitness, including: ? Strength. ? Flexibility. Contact a health care provider if:  Your pain does not improve with 2-4 weeks. Get help right away if:  You develop severe pain.  You have a fever.  You develop increased redness over your hip.  You have a change in your bowel function or bladder function.  You cannot control the muscles in your feet. This information is not intended to replace advice given to you by your health care provider. Make sure you discuss any questions you have with your health care provider. Document Released: 03/09/2004 Document Revised: 10/06/2015 Document Reviewed: 01/15/2015 [employer] Interactive Patient Education  2018 [employer]  

## 2017-05-08 ENCOUNTER — Ambulatory Visit (HOSPITAL_COMMUNITY)
Admission: EM | Admit: 2017-05-08 | Discharge: 2017-05-08 | Disposition: A | Discharge status: Home / Self Care | Payer: BLUE CROSS/BLUE SHIELD | Attending: Family Medicine | Admitting: Family Medicine

## 2017-05-08 ENCOUNTER — Encounter (HOSPITAL_COMMUNITY): Payer: Self-pay | Admitting: Family Medicine

## 2017-05-08 ENCOUNTER — Ambulatory Visit (INDEPENDENT_AMBULATORY_CARE_PROVIDER_SITE_OTHER): Payer: BLUE CROSS/BLUE SHIELD

## 2017-05-08 DIAGNOSIS — J40 Bronchitis, not specified as acute or chronic: Secondary | ICD-10-CM | POA: Diagnosis not present

## 2017-05-08 DIAGNOSIS — R05 Cough: Secondary | ICD-10-CM | POA: Diagnosis not present

## 2017-05-08 DIAGNOSIS — R0602 Shortness of breath: Secondary | ICD-10-CM | POA: Diagnosis not present

## 2017-05-08 MED ORDER — PREDNISONE 20 MG PO TABS: 40.0000 mg | ORAL_TABLET | Freq: Every day | ORAL | 0 refills | Status: AC

## 2017-05-08 MED ORDER — BENZONATATE 100 MG PO CAPS: 100.0000 mg | ORAL_CAPSULE | Freq: Three times a day (TID) | ORAL | 0 refills | Status: DC

## 2017-05-08 MED ORDER — AZITHROMYCIN 250 MG PO TABS: 250.0000 mg | ORAL_TABLET | Freq: Every day | ORAL | 0 refills | Status: DC

## 2017-05-08 NOTE — ED Provider Notes (Signed)
[place]    CSN: [id number] Arrival date & time: 05/08/17  1444     History   Chief Complaint Chief Complaint  Patient presents with  . Cough  . Chills    HPI Calvin Burke is a 62 y.o. male.   62 year old male comes in for 3-week history of URI symptoms.  States symptoms slightly improved 1-2 weeks ago before worsening again.  Denies complete resolution of symptoms.  Currently having productive cough, rhinorrhea, cold chills/night sweats, body aches, loss of appetite.  Denies known fever.  He has been taking  OTC cold medication with some relief.  No obvious sick contact.  Never smoker.     Past Medical History:  Diagnosis Date  . Cancer [facility]) 1982   testicular  . Small bowel obstruction ([place])    x 1998 & 2012    Patient Active Problem List   Diagnosis Date Noted  . Trochanteric bursitis of left hip 03/20/2017  . History of testicular cancer 10/25/2016  . Precordial pain 02/25/2015  . Well adult exam 09/20/2011  . GERD (gastroesophageal reflux disease) 09/20/2011  . Hyperlipidemia with target LDL less than 130 12/20/2006    Past Surgical History:  Procedure Laterality Date  . NEPHRECTOMY  1982   for cancer - left kidney removed  . SURGERY SCROTAL / TESTICULAR  1982   for cancer       Home Medications    Prior to Admission medications   Medication Sig Start Date End Date Taking? Authorizing Provider  aspirin 81 MG EC tablet TAKE 1 TABLET (81 MG TOTAL) BY MOUTH DAILY. 03/08/16   Janith Lima, MD  atorvastatin (LIPITOR) 10 MG tablet Take 1 tablet (10 mg total) by mouth daily. Patient not taking: Reported on 03/20/2017 10/31/16 10/31/17  Plotnikov, Evie Lacks, MD  azithromycin (ZITHROMAX) 250 MG tablet Take 1 tablet (250 mg total) by mouth daily. Take first 2 tablets together, then 1 every day until finished. 05/08/17   Tasia Catchings, Axel Meas V, PA-C  benzonatate (TESSALON) 100 MG capsule Take 1 capsule (100 mg total) by mouth every 8 (eight) hours. 05/08/17    Ok Edwards, PA-C  Cholecalciferol (VITAMIN D3) 2000 units capsule Take 1 capsule (2,000 Units total) by mouth daily. 10/25/16   Plotnikov, Evie Lacks, MD  Omega-3 Fatty Acids (FISH OIL) 1000 MG CAPS Take by mouth.    [provider]  predniSONE (DELTASONE) 20 MG tablet Take 2 tablets (40 mg total) by mouth daily for 4 days. 05/08/17 05/12/17  Ok Edwards, PA-C    Family History Family History  Problem Relation Age of Onset  . Alcohol abuse Mother   . Throat cancer Mother   . Cancer Father   . Heart disease Father        a fib  . Diabetes Neg Hx   . Early death Neg Hx   . Hyperlipidemia Neg Hx   . Hypertension Neg Hx   . Kidney disease Neg Hx   . Stroke Neg Hx     Social History Social History   Tobacco Use  . Smoking status: Never Smoker  . Smokeless tobacco: Never Used  Substance Use Topics  . Alcohol use: No    Comment: former use x20 years ago  . Drug use: No     Allergies   Bleomycin   Review of Systems Review of Systems  Reason unable to perform ROS: See HPI as above.     Physical Exam Triage Vital Signs  ED Triage Vitals  Enc Vitals Group     BP 05/08/17 1520 122/66     Pulse Rate 05/08/17 1520 (!) 103     Resp 05/08/17 1520 18     Temp 05/08/17 1520 99 F (37.2 C)     Temp Source 05/08/17 1520 Oral     SpO2 05/08/17 1520 97 %     Weight --      Height --      Head Circumference --      Peak Flow --      Pain Score 05/08/17 1519 0     Pain Loc --      Pain Edu? --      Excl. in [place]? --    No data found.  Updated Vital Signs BP 122/66   Pulse (!) 103   Temp 99 F (37.2 C) (Oral)   Resp 18   SpO2 97%   Physical Exam  Constitutional: He is oriented to person, place, and time. He appears well-developed and well-nourished. No distress.  HENT:  Head: Normocephalic and atraumatic.  Right Ear: Tympanic membrane, external ear and ear canal normal. Tympanic membrane is not erythematous and not bulging.  Left Ear: Tympanic membrane, external  ear and ear canal normal. Tympanic membrane is not erythematous and not bulging.  Nose: Nose normal. Right sinus exhibits no maxillary sinus tenderness and no frontal sinus tenderness. Left sinus exhibits no maxillary sinus tenderness and no frontal sinus tenderness.  Mouth/Throat: Uvula is midline, oropharynx is clear and moist and mucous membranes are normal.  Eyes: Pupils are equal, round, and reactive to light. Conjunctivae are normal.  Neck: Normal range of motion. Neck supple.  Cardiovascular: Normal rate, regular rhythm and normal heart sounds. Exam reveals no gallop and no friction rub.  No murmur heard. Pulmonary/Chest: Effort normal. No accessory muscle usage. No respiratory distress.  Coarse breath sounds diffusely.  Lymphadenopathy:    He has no cervical adenopathy.  Neurological: He is alert and oriented to person, place, and time.  Skin: Skin is warm and dry.  Psychiatric: He has a normal mood and affect. His behavior is normal. Judgment normal.     UC Treatments / Results  Labs (all labs ordered are listed, but only abnormal results are displayed) Labs Reviewed - No data to display  EKG None Radiology Dg Chest 2 View  Result Date: 05/08/2017 CLINICAL DATA:  Cough, shortness breath, fatigue, and fever for 3 weeks. EXAM: CHEST - 2 VIEW COMPARISON:  None. FINDINGS: The heart size and mediastinal contours are within normal limits. Both lungs are clear. The visualized skeletal structures are unremarkable. IMPRESSION: No active cardiopulmonary disease. Electronically Signed   By: Earle Gell M.D.   On: 05/08/2017 16:12    Procedures Procedures (including critical care time)  Medications Ordered in UC Medications - No data to display   Initial Impression / Assessment and Plan / UC Course  I have reviewed the triage vital signs and the nursing notes.  Pertinent labs & imaging results that were available during my care of the patient were reviewed by me and considered in  my medical decision making (see chart for details).    Chest x-ray negative for pneumonia.  Azithromycin and prednisone for bronchitis.  Other symptomatic treatment discussed.  Push fluids.  Return precautions given.  Patient expresses understanding and agrees to plan.  Final Clinical Impressions(s) / UC Diagnoses   Final diagnoses:  Bronchitis    ED Discharge Orders  Ordered    azithromycin (ZITHROMAX) 250 MG tablet  Daily     05/08/17 1624    predniSONE (DELTASONE) 20 MG tablet  Daily     05/08/17 1624    benzonatate (TESSALON) 100 MG capsule  Every 8 hours     05/08/17 1624        Ok Edwards, PA-C 05/08/17 1633

## 2017-05-08 NOTE — Discharge Instructions (Signed)
Chest xray negative for pneumonia. Start azithromycin and prednisone for bronchitis. Tessalon for cough. You can continue your other cold medicines to help with the symptoms. Keep hydrated, your urine should be clear to pale yellow in color. Tylenol/motrin for fever and pain. Monitor for any worsening of symptoms, chest pain, shortness of breath, wheezing, swelling of the throat, follow up for reevaluation.

## 2017-05-08 NOTE — ED Triage Notes (Signed)
Pt here for 3 weeks of cough, congestion, chills. sts that the 1st week was the worse, 2nd week he got better and now it has returned and worsened. Taking mucinex DM for cough and congestion. Also sudafed.

## 2017-05-28 DIAGNOSIS — H40033 Anatomical narrow angle, bilateral: Secondary | ICD-10-CM | POA: Diagnosis not present

## 2017-05-28 DIAGNOSIS — H2513 Age-related nuclear cataract, bilateral: Secondary | ICD-10-CM | POA: Diagnosis not present

## 2017-08-30 DIAGNOSIS — H25813 Combined forms of age-related cataract, bilateral: Secondary | ICD-10-CM | POA: Diagnosis not present

## 2017-08-30 DIAGNOSIS — H40033 Anatomical narrow angle, bilateral: Secondary | ICD-10-CM | POA: Diagnosis not present

## 2017-08-30 DIAGNOSIS — H18413 Arcus senilis, bilateral: Secondary | ICD-10-CM | POA: Diagnosis not present

## 2017-08-30 DIAGNOSIS — H40023 Open angle with borderline findings, high risk, bilateral: Secondary | ICD-10-CM | POA: Diagnosis not present

## 2018-02-25 ENCOUNTER — Encounter: Payer: Self-pay | Admitting: Internal Medicine

## 2018-02-25 ENCOUNTER — Other Ambulatory Visit (INDEPENDENT_AMBULATORY_CARE_PROVIDER_SITE_OTHER): Payer: BLUE CROSS/BLUE SHIELD

## 2018-02-25 ENCOUNTER — Ambulatory Visit (INDEPENDENT_AMBULATORY_CARE_PROVIDER_SITE_OTHER): Payer: BLUE CROSS/BLUE SHIELD | Admitting: Internal Medicine

## 2018-02-25 VITALS — BP 128/82 | HR 71 | Temp 98.1°F | Ht 73.0 in | Wt 197.0 lb

## 2018-02-25 DIAGNOSIS — Z0000 Encounter for general adult medical examination without abnormal findings: Secondary | ICD-10-CM

## 2018-02-25 DIAGNOSIS — Z8547 Personal history of malignant neoplasm of testis: Secondary | ICD-10-CM

## 2018-02-25 DIAGNOSIS — Z0001 Encounter for general adult medical examination with abnormal findings: Secondary | ICD-10-CM | POA: Diagnosis not present

## 2018-02-25 DIAGNOSIS — E785 Hyperlipidemia, unspecified: Secondary | ICD-10-CM | POA: Diagnosis not present

## 2018-02-25 DIAGNOSIS — L570 Actinic keratosis: Secondary | ICD-10-CM | POA: Diagnosis not present

## 2018-02-25 DIAGNOSIS — M7062 Trochanteric bursitis, left hip: Secondary | ICD-10-CM | POA: Diagnosis not present

## 2018-02-25 LAB — URINALYSIS
Bilirubin Urine: NEGATIVE
HGB URINE DIPSTICK: NEGATIVE
KETONES UR: NEGATIVE
Leukocytes, UA: NEGATIVE
NITRITE: NEGATIVE
PH: 5.5 (ref 5.0–8.0)
Specific Gravity, Urine: 1.025 (ref 1.000–1.030)
TOTAL PROTEIN, URINE-UPE24: NEGATIVE
URINE GLUCOSE: NEGATIVE
Urobilinogen, UA: 0.2 (ref 0.0–1.0)

## 2018-02-25 LAB — CBC WITH DIFFERENTIAL/PLATELET
Basophils Absolute: 0 10*3/uL (ref 0.0–0.1)
Basophils Relative: 0.7 % (ref 0.0–3.0)
EOS PCT: 1.8 % (ref 0.0–5.0)
Eosinophils Absolute: 0.1 10*3/uL (ref 0.0–0.7)
HCT: 45.4 % (ref 39.0–52.0)
Hemoglobin: 16.2 g/dL (ref 13.0–17.0)
LYMPHS ABS: 1.1 10*3/uL (ref 0.7–4.0)
Lymphocytes Relative: 22.9 % (ref 12.0–46.0)
MCHC: 35.7 g/dL (ref 30.0–36.0)
MCV: 91.7 fl (ref 78.0–100.0)
MONO ABS: 0.7 10*3/uL (ref 0.1–1.0)
Monocytes Relative: 15.3 % — ABNORMAL HIGH (ref 3.0–12.0)
Neutro Abs: 2.9 10*3/uL (ref 1.4–7.7)
Neutrophils Relative %: 59.3 % (ref 43.0–77.0)
Platelets: 215 10*3/uL (ref 150.0–400.0)
RBC: 4.95 Mil/uL (ref 4.22–5.81)
RDW: 12.8 % (ref 11.5–15.5)
WBC: 4.8 10*3/uL (ref 4.0–10.5)

## 2018-02-25 LAB — LIPID PANEL
CHOL/HDL RATIO: 4
Cholesterol: 207 mg/dL — ABNORMAL HIGH (ref 0–200)
HDL: 54.4 mg/dL (ref >39.00)
LDL Cholesterol: 130 mg/dL — ABNORMAL HIGH (ref 0–99)
NONHDL: 152.15
TRIGLYCERIDES: 109 mg/dL (ref 0.0–149.0)
VLDL: 21.8 mg/dL (ref 0.0–40.0)

## 2018-02-25 LAB — BASIC METABOLIC PANEL
BUN: 23 mg/dL (ref 6–23)
CHLORIDE: 105 meq/L (ref 96–112)
CO2: 26 mEq/L (ref 19–32)
Calcium: 9.8 mg/dL (ref 8.4–10.5)
Creatinine, Ser: 0.94 mg/dL (ref 0.40–1.50)
GFR: 86.26 mL/min (ref >60.00)
GLUCOSE: 87 mg/dL (ref 70–99)
POTASSIUM: 4 meq/L (ref 3.5–5.1)
Sodium: 140 mEq/L (ref 135–145)

## 2018-02-25 LAB — HEPATIC FUNCTION PANEL
ALT: 21 U/L (ref 0–53)
AST: 18 U/L (ref 0–37)
Albumin: 4.3 g/dL (ref 3.5–5.2)
Alkaline Phosphatase: 58 U/L (ref 39–117)
BILIRUBIN DIRECT: 0.1 mg/dL (ref 0.0–0.3)
BILIRUBIN TOTAL: 0.5 mg/dL (ref 0.2–1.2)
Total Protein: 6.8 g/dL (ref 6.0–8.3)

## 2018-02-25 LAB — PSA: PSA: 0.3 ng/mL (ref 0.10–4.00)

## 2018-02-25 LAB — HCG, QUANTITATIVE, PREGNANCY: Quantitative HCG: 0.36 m[IU]/mL

## 2018-02-25 LAB — TSH: TSH: 1.27 u[IU]/mL (ref 0.35–4.50)

## 2018-02-25 MED ORDER — SILDENAFIL CITRATE 100 MG PO TABS: 100.0000 mg | ORAL_TABLET | ORAL | 11 refills | Status: DC | PRN

## 2018-02-25 NOTE — Assessment & Plan Note (Signed)
We discussed age appropriate health related issues, including available/recomended screening tests and vaccinations. We discussed a need for adhering to healthy diet and exercise. Labs were ordered to be later reviewed . All questions were answered.   cardiac CT scan for calcium scoring offered

## 2018-02-25 NOTE — Assessment & Plan Note (Signed)
cardiac CT scan for calcium scoring offered Try pravachol

## 2018-02-25 NOTE — Assessment & Plan Note (Signed)
LDH, HCG, AFP

## 2018-02-25 NOTE — Assessment & Plan Note (Signed)
Better Hip opener exercises

## 2018-02-25 NOTE — Progress Notes (Signed)
Subjective:  Patient ID: Calvin Burke, male    DOB: 1955-03-23  Age: 63 y.o. MRN: [id number]  CC: No chief complaint on file.   HPI KENDRICKS REAP presents for a well exam C/o LBP, hip stiffness  Outpatient Medications Prior to Visit  Medication Sig Dispense Refill  . aspirin 81 MG EC tablet TAKE 1 TABLET (81 MG TOTAL) BY MOUTH DAILY. 90 tablet 0  . Cholecalciferol (VITAMIN D3) 2000 units capsule Take 1 capsule (2,000 Units total) by mouth daily. 100 capsule 3  . Omega-3 Fatty Acids (FISH OIL) 1000 MG CAPS Take by mouth.    Marland Kitchen atorvastatin (LIPITOR) 10 MG tablet Take 1 tablet (10 mg total) by mouth daily. (Patient not taking: Reported on 03/20/2017) 30 tablet 11  . azithromycin (ZITHROMAX) 250 MG tablet Take 1 tablet (250 mg total) by mouth daily. Take first 2 tablets together, then 1 every day until finished. 6 tablet 0  . benzonatate (TESSALON) 100 MG capsule Take 1 capsule (100 mg total) by mouth every 8 (eight) hours. 21 capsule 0   No facility-administered medications prior to visit.     ROS: Review of Systems  Constitutional: Negative for appetite change, fatigue and unexpected weight change.  HENT: Negative for congestion, nosebleeds, sneezing, sore throat and trouble swallowing.   Eyes: Negative for itching and visual disturbance.  Respiratory: Negative for cough.   Cardiovascular: Negative for chest pain, palpitations and leg swelling.  Gastrointestinal: Negative for abdominal distention, blood in stool, diarrhea and nausea.  Genitourinary: Negative for frequency and hematuria.  Musculoskeletal: Positive for back pain. Negative for gait problem, joint swelling and neck pain.  Skin: Negative for rash.  Neurological: Negative for dizziness, tremors, speech difficulty and weakness.  Psychiatric/Behavioral: Negative for agitation, dysphoric mood and sleep disturbance. The patient is not nervous/anxious.     Objective:  BP 128/82 (BP Location: Left Arm, Patient Position:  Sitting, Cuff Size: Normal)   Pulse 71   Temp 98.1 F (36.7 C) (Oral)   Ht 6\' 1"  (1.854 m)   Wt 197 lb (89.4 kg)   SpO2 97%   BMI 25.99 kg/m   BP Readings from Last 3 Encounters:  02/25/18 128/82  05/08/17 122/66  03/20/17 122/72    Wt Readings from Last 3 Encounters:  02/25/18 197 lb (89.4 kg)  03/20/17 198 lb (89.8 kg)  10/25/16 193 lb (87.5 kg)    Physical Exam Constitutional:      General: He is not in acute distress.    Appearance: He is well-developed.     Comments: NAD  Eyes:     Conjunctiva/sclera: Conjunctivae normal.     Pupils: Pupils are equal, round, and reactive to light.  Neck:     Musculoskeletal: Normal range of motion.     Thyroid: No thyromegaly.     Vascular: No JVD.  Cardiovascular:     Rate and Rhythm: Normal rate and regular rhythm.     Heart sounds: Normal heart sounds. No murmur. No friction rub. No gallop.   Pulmonary:     Effort: Pulmonary effort is normal. No respiratory distress.     Breath sounds: Normal breath sounds. No wheezing or rales.  Chest:     Chest wall: No tenderness.  Abdominal:     General: Bowel sounds are normal. There is no distension.     Palpations: Abdomen is soft. There is no mass.     Tenderness: There is no abdominal tenderness. There is no guarding or rebound.  Genitourinary:    Penis: Normal.      Prostate: Normal.     Rectum: Normal.  Musculoskeletal: Normal range of motion.        General: No tenderness.  Lymphadenopathy:     Cervical: No cervical adenopathy.  Skin:    General: Skin is warm and dry.     Findings: No rash.  Neurological:     Mental Status: He is alert and oriented to person, place, and time.     Cranial Nerves: No cranial nerve deficit.     Motor: No abnormal muscle tone.     Coordination: Coordination normal.     Gait: Gait normal.     Deep Tendon Reflexes: Reflexes are normal and symmetric.  Psychiatric:        Behavior: Behavior normal.        Thought Content: Thought content  normal.        Judgment: Judgment normal.   L testis absent Prostate 1+  face, scalp -- AKs   Procedure Note :     Procedure : Cryosurgery   Indication:  Actinic keratosis(es)   Risks including unsuccessful procedure , bleeding, infection, bruising, scar, a need for a repeat  procedure and others were explained to the patient in detail as well as the benefits. Informed consent was obtained verbally.    2 lesion(s)  on  Face, scalp  was/were treated with liquid nitrogen on a Q-tip in a usual fasion . Band-Aid was applied and antibiotic ointment was given for a later use.   Tolerated well. Complications none.   Postprocedure instructions :     Keep the wounds clean. You can wash them with liquid soap and water. Pat dry with gauze or a Kleenex tissue  Before applying antibiotic ointment and a Band-Aid.   You need to report immediately  if  any signs of infection develop.     Lab Results  Component Value Date   WBC 6.4 10/25/2016   HGB 15.9 10/25/2016   HCT 45.8 10/25/2016   PLT 217.0 10/25/2016   GLUCOSE 87 10/25/2016   CHOL 250 (H) 10/25/2016   TRIG 98.0 10/25/2016   HDL 66.60 10/25/2016   LDLDIRECT 153.5 09/20/2011   LDLCALC 164 (H) 10/25/2016   ALT 20 10/25/2016   AST 19 10/25/2016   NA 138 10/25/2016   K 3.6 10/25/2016   CL 101 10/25/2016   CREATININE 1.03 10/25/2016   BUN 23 10/25/2016   CO2 27 10/25/2016   TSH 1.10 10/25/2016   PSA 0.30 09/30/2014   HGBA1C 5.3 03/07/2011    Dg Chest 2 View  Result Date: 05/08/2017 CLINICAL DATA:  Cough, shortness breath, fatigue, and fever for 3 weeks. EXAM: CHEST - 2 VIEW COMPARISON:  None. FINDINGS: The heart size and mediastinal contours are within normal limits. Both lungs are clear. The visualized skeletal structures are unremarkable. IMPRESSION: No active cardiopulmonary disease. Electronically Signed   By: Earle Gell M.D.   On: 05/08/2017 16:12    Assessment & Plan:   There are no diagnoses linked to this  encounter.   No orders of the defined types were placed in this encounter.    Follow-up: No follow-ups on file.  Walker Kehr, MD

## 2018-02-25 NOTE — Assessment & Plan Note (Signed)
See cryo 

## 2018-02-25 NOTE — Patient Instructions (Signed)

## 2018-02-26 LAB — AFP TUMOR MARKER: AFP-Tumor Marker: 3.1 ng/mL (ref <6.1)

## 2018-02-26 LAB — LACTATE DEHYDROGENASE: LDH: 155 U/L (ref 120–250)

## 2018-03-12 DIAGNOSIS — H25013 Cortical age-related cataract, bilateral: Secondary | ICD-10-CM | POA: Diagnosis not present

## 2018-03-12 DIAGNOSIS — H2513 Age-related nuclear cataract, bilateral: Secondary | ICD-10-CM | POA: Diagnosis not present

## 2018-03-12 DIAGNOSIS — H18413 Arcus senilis, bilateral: Secondary | ICD-10-CM | POA: Diagnosis not present

## 2018-03-12 DIAGNOSIS — H40033 Anatomical narrow angle, bilateral: Secondary | ICD-10-CM | POA: Diagnosis not present

## 2018-03-25 ENCOUNTER — Inpatient Hospital Stay: Admission: RE | Admit: 2018-03-25 | Payer: BLUE CROSS/BLUE SHIELD | Source: Ambulatory Visit

## 2018-05-07 ENCOUNTER — Telehealth (HOSPITAL_COMMUNITY): Payer: Self-pay | Admitting: *Deleted

## 2018-05-07 NOTE — Telephone Encounter (Signed)
Spoke with patient about cancelling of CT. Patient Thanked me for call.

## 2018-05-20 ENCOUNTER — Inpatient Hospital Stay: Admission: RE | Admit: 2018-05-20 | Payer: BLUE CROSS/BLUE SHIELD | Source: Ambulatory Visit

## 2018-05-22 NOTE — Telephone Encounter (Signed)
Opened in error

## 2018-07-22 ENCOUNTER — Telehealth: Payer: Self-pay | Admitting: *Deleted

## 2018-07-22 NOTE — Telephone Encounter (Signed)

## 2018-07-23 ENCOUNTER — Other Ambulatory Visit: Payer: Self-pay

## 2018-07-23 ENCOUNTER — Ambulatory Visit (INDEPENDENT_AMBULATORY_CARE_PROVIDER_SITE_OTHER)
Admission: RE | Admit: 2018-07-23 | Discharge: 2018-07-23 | Disposition: A | Discharge status: Home / Self Care | Payer: Self-pay | Source: Ambulatory Visit | Attending: Internal Medicine | Admitting: Internal Medicine

## 2018-07-23 DIAGNOSIS — E785 Hyperlipidemia, unspecified: Secondary | ICD-10-CM

## 2018-08-27 ENCOUNTER — Encounter: Payer: Self-pay | Admitting: Internal Medicine

## 2018-08-27 ENCOUNTER — Ambulatory Visit (INDEPENDENT_AMBULATORY_CARE_PROVIDER_SITE_OTHER): Payer: BC Managed Care – PPO | Admitting: Internal Medicine

## 2018-08-27 ENCOUNTER — Other Ambulatory Visit: Payer: Self-pay

## 2018-08-27 DIAGNOSIS — M545 Low back pain, unspecified: Secondary | ICD-10-CM | POA: Insufficient documentation

## 2018-08-27 DIAGNOSIS — G8929 Other chronic pain: Secondary | ICD-10-CM | POA: Diagnosis not present

## 2018-08-27 NOTE — Patient Instructions (Signed)
  These suggestions will probably help you to lose weight: 1.  Reduce your consumption of sugars and starches.  Eliminate high fructose corn syrup from your diet.  Reduce your consumption of processed foods.  For desserts try to have seasonal fruits, berries with with green, knots, cheeses or dark chocolate with more than 70% cacao. 2.  Do not snack 3.  You do not have to eat breakfast.  If you choose to have breakfast-eat plain greek yogurt, eggs, oatmeal (without sugar) 4.  Drink water, freshly brewed unsweetened tea (green, black or herbal) or coffee.  Do not drink sodas including diet sodas , juices, beverages sweetened with artificial sweeteners. 5.  Reduce your consumption of refined grains. 6.  Avoid protein drinks such as Optifast, Slim fast etc. Eat chicken, fish, meat, dairy and beings for your sources of protein 7.  Natural unprocessed fats like cold pressed virgin olive oil, butter, coconut oil are good for you.  Eat avocados 8.  Increase your consumption of fiber.  Fruits, berries, vegetables, whole grains, flaxseeds, Chia seeds, beans, popcorn, nuts, oatmeal are good sources of fiber 9.  Use vinegar in your diet, i.e. apple cider vinegar, red wine or balsamic vinegar 10.  You can try fasting.  For example you can skip breakfast and lunch every other day (24-hour fast)    Mediterranean diet is good for you.  If you drink alcohol, limit your alcohol intake to no more than 2 drinks a day. The Mediterranean diet is a way of eating based on the traditional cuisine of countries bordering the [employer]. While there is no single definition of the Mediterranean diet, it is typically high in vegetables, fruits, whole grains, beans, nut and seeds, and olive oil. The main components of Mediterranean diet include: Marland Kitchen Daily consumption of vegetables, fruits, whole grains and healthy fats  . Weekly intake of fish, poultry, beans and eggs  . Moderate portions of dairy products  . Limited  intake of red meat Other important elements of the Mediterranean diet are sharing meals with family and friends, enjoying a glass of red wine and being physically active.

## 2018-08-27 NOTE — Assessment & Plan Note (Addendum)
MSK - stretch X ray if worse

## 2018-08-27 NOTE — Progress Notes (Signed)
New Patient Office Visit  Subjective:  Patient ID: Calvin Burke, male    DOB: April 12, 1955  Age: 63 y.o. MRN: [id number]  CC: No chief complaint on file.   HPI Calvin Burke presents for aorta atherosclerosis on CT, B12 def, dyslipidemia  Past Medical History:  Diagnosis Date  . Cancer [facility]) 1982   testicular  . Small bowel obstruction ([place])    x 1998 & 2012    Past Surgical History:  Procedure Laterality Date  . NEPHRECTOMY  1982   for cancer - left kidney removed  . SURGERY SCROTAL / TESTICULAR  1982   for cancer    Family History  Problem Relation Age of Onset  . Alcohol abuse Mother   . Throat cancer Mother   . Cancer Father   . Heart disease Father        a fib  . Diabetes Neg Hx   . Early death Neg Hx   . Hyperlipidemia Neg Hx   . Hypertension Neg Hx   . Kidney disease Neg Hx   . Stroke Neg Hx     Social History   Socioeconomic History  . Marital status: Married    Spouse name: Not on file  . Number of children: Not on file  . Years of education: Not on file  . Highest education level: Not on file  Occupational History  . Not on file  Social Needs  . Financial resource strain: Not on file  . Food insecurity    Worry: Not on file    Inability: Not on file  . Transportation needs    Medical: Not on file    Non-medical: Not on file  Tobacco Use  . Smoking status: Never Smoker  . Smokeless tobacco: Never Used  Substance and Sexual Activity  . Alcohol use: No    Comment: former use x20 years ago  . Drug use: No  . Sexual activity: Yes  Lifestyle  . Physical activity    Days per week: Not on file    Minutes per session: Not on file  . Stress: Not on file  Relationships  . Social Herbalist on phone: Not on file    Gets together: Not on file    Attends religious service: Not on file    Active member of club or organization: Not on file    Attends meetings of clubs or organizations: Not on file    Relationship status: Not  on file  . Intimate partner violence    Fear of current or ex partner: Not on file    Emotionally abused: Not on file    Physically abused: Not on file    Forced sexual activity: Not on file  Other Topics Concern  . Not on file  Social History Narrative  . Not on file    ROS Review of Systems  Constitutional: Negative for appetite change, fatigue and unexpected weight change.  HENT: Negative for congestion, nosebleeds, sneezing, sore throat and trouble swallowing.   Eyes: Negative for itching and visual disturbance.  Respiratory: Negative for cough.   Cardiovascular: Negative for chest pain, palpitations and leg swelling.  Gastrointestinal: Negative for abdominal distention, blood in stool, diarrhea and nausea.  Genitourinary: Negative for frequency and hematuria.  Musculoskeletal: Positive for back pain. Negative for gait problem, joint swelling and neck pain.  Skin: Negative for rash.  Neurological: Negative for dizziness, tremors, speech difficulty and weakness.  Psychiatric/Behavioral: Negative for agitation, dysphoric  mood and sleep disturbance. The patient is not nervous/anxious.     Objective:   Today's Vitals: BP 120/72 (BP Location: Left Arm, Patient Position: Sitting, Cuff Size: Normal)   Pulse 77   Temp 98.6 F (37 C) (Oral)   Ht 6\' 1"  (1.854 m)   Wt 192 lb (87.1 kg)   SpO2 97%   BMI 25.33 kg/m   Physical Exam Constitutional:      General: He is not in acute distress.    Appearance: He is well-developed.     Comments: NAD  Eyes:     Conjunctiva/sclera: Conjunctivae normal.     Pupils: Pupils are equal, round, and reactive to light.  Neck:     Musculoskeletal: Normal range of motion.     Thyroid: No thyromegaly.     Vascular: No JVD.  Cardiovascular:     Rate and Rhythm: Normal rate and regular rhythm.     Heart sounds: Normal heart sounds. No murmur. No friction rub. No gallop.   Pulmonary:     Effort: Pulmonary effort is normal. No respiratory  distress.     Breath sounds: Normal breath sounds. No wheezing or rales.  Chest:     Chest wall: No tenderness.  Abdominal:     General: Bowel sounds are normal. There is no distension.     Palpations: Abdomen is soft. There is no mass.     Tenderness: There is no abdominal tenderness. There is no guarding or rebound.  Musculoskeletal: Normal range of motion.        General: No tenderness.  Lymphadenopathy:     Cervical: No cervical adenopathy.  Skin:    General: Skin is warm and dry.     Findings: No rash.  Neurological:     Mental Status: He is alert and oriented to person, place, and time.     Cranial Nerves: No cranial nerve deficit.     Motor: No abnormal muscle tone.     Coordination: Coordination normal.     Gait: Gait normal.     Deep Tendon Reflexes: Reflexes are normal and symmetric.  Psychiatric:        Behavior: Behavior normal.        Thought Content: Thought content normal.        Judgment: Judgment normal.    LS w full ROM   Assessment & Plan:   Problem List Items Addressed This Visit    None      Outpatient Encounter Medications as of 08/27/2018  Medication Sig  . aspirin 81 MG EC tablet TAKE 1 TABLET (81 MG TOTAL) BY MOUTH DAILY.  Marland Kitchen Cholecalciferol (VITAMIN D3) 2000 units capsule Take 1 capsule (2,000 Units total) by mouth daily.  . COLLAGEN PO Take by mouth.  . Omega-3 Fatty Acids (FISH OIL) 1000 MG CAPS Take by mouth.  . sildenafil (VIAGRA) 100 MG tablet Take 1 tablet (100 mg total) by mouth as needed for erectile dysfunction.  . vitamin B-12 (CYANOCOBALAMIN) 1000 MCG tablet Take 1,000 mcg by mouth daily.   No facility-administered encounter medications on file as of 08/27/2018.     Follow-up: No follow-ups on file.   Walker Kehr, MD

## 2018-09-27 ENCOUNTER — Telehealth: Payer: Self-pay | Admitting: Internal Medicine

## 2018-09-27 MED ORDER — SILDENAFIL CITRATE 100 MG PO TABS: 100.0000 mg | ORAL_TABLET | ORAL | 11 refills | Status: DC | PRN

## 2018-09-27 NOTE — Telephone Encounter (Signed)
Rx sent. See meds. Pt informed.  

## 2018-09-27 NOTE — Telephone Encounter (Signed)
Relation to pt: self  Call back number:  [phone] Pharmacy: [id number] - [place], [address]. [phone] (Phone) [phone] (Fax)     Reason for call:  Patient checking on the status of erectile dysfunction medication patient states it was discussed at 02/25/2018 appointment, please advise

## 2019-04-01 ENCOUNTER — Encounter: Payer: Self-pay | Admitting: Internal Medicine

## 2019-04-01 ENCOUNTER — Ambulatory Visit (INDEPENDENT_AMBULATORY_CARE_PROVIDER_SITE_OTHER): Payer: BC Managed Care – PPO | Admitting: Internal Medicine

## 2019-04-01 ENCOUNTER — Other Ambulatory Visit: Payer: Self-pay

## 2019-04-01 VITALS — BP 124/72 | HR 82 | Temp 98.8°F | Ht 73.0 in | Wt 188.4 lb

## 2019-04-01 DIAGNOSIS — M545 Low back pain, unspecified: Secondary | ICD-10-CM

## 2019-04-01 DIAGNOSIS — M25551 Pain in right hip: Secondary | ICD-10-CM

## 2019-04-01 DIAGNOSIS — Z0000 Encounter for general adult medical examination without abnormal findings: Secondary | ICD-10-CM | POA: Diagnosis not present

## 2019-04-01 DIAGNOSIS — Z8547 Personal history of malignant neoplasm of testis: Secondary | ICD-10-CM

## 2019-04-01 DIAGNOSIS — G8929 Other chronic pain: Secondary | ICD-10-CM

## 2019-04-01 DIAGNOSIS — Z905 Acquired absence of kidney: Secondary | ICD-10-CM

## 2019-04-01 MED ORDER — METHYLPREDNISOLONE 4 MG PO TBPK: ORAL_TABLET | ORAL | 0 refills | Status: DC

## 2019-04-01 NOTE — Assessment & Plan Note (Signed)
?  OA X ray R hip

## 2019-04-01 NOTE — Assessment & Plan Note (Signed)
Worse X ray Medrol pack Vit D Stretxch w/wife

## 2019-04-01 NOTE — Assessment & Plan Note (Signed)
Labs

## 2019-04-01 NOTE — Progress Notes (Signed)
Subjective:  Patient ID: Calvin Burke, male    DOB: 12-03-1955  Age: 64 y.o. MRN: [id number]  CC: No chief complaint on file.   HPI Calvin Burke presents for a well exam C/o R>L pain x 1 week He had a blister on R anterior ankle Legs seems weaker in the past year, LBP Cologuard 2018 (-)  Outpatient Medications Prior to Visit  Medication Sig Dispense Refill  . ALPRAZolam (XANAX) 0.5 MG tablet Take 0.5 mg by mouth at bedtime as needed.    Marland Kitchen aspirin 81 MG EC tablet TAKE 1 TABLET (81 MG TOTAL) BY MOUTH DAILY. 90 tablet 0  . Cholecalciferol (VITAMIN D3) 2000 units capsule Take 1 capsule (2,000 Units total) by mouth daily. 100 capsule 3  . COLLAGEN PO Take by mouth.    . finasteride (PROPECIA) 1 MG tablet Take 1 mg by mouth daily.    . Omega-3 Fatty Acids (FISH OIL) 1000 MG CAPS Take by mouth.    . sildenafil (VIAGRA) 100 MG tablet Take 1 tablet (100 mg total) by mouth as needed for erectile dysfunction. 12 tablet 11  . vitamin B-12 (CYANOCOBALAMIN) 1000 MCG tablet Take 1,000 mcg by mouth daily.     No facility-administered medications prior to visit.    ROS: Review of Systems  Constitutional: Negative for appetite change, fatigue and unexpected weight change.  HENT: Negative for congestion, nosebleeds, sneezing, sore throat and trouble swallowing.   Eyes: Negative for itching and visual disturbance.  Respiratory: Negative for cough.   Cardiovascular: Negative for chest pain, palpitations and leg swelling.  Gastrointestinal: Negative for abdominal distention, blood in stool, diarrhea and nausea.  Genitourinary: Negative for frequency and hematuria.  Musculoskeletal: Positive for back pain and gait problem. Negative for joint swelling and neck pain.  Skin: Negative for rash.  Neurological: Negative for dizziness, tremors, speech difficulty and weakness.  Psychiatric/Behavioral: Negative for agitation, dysphoric mood and sleep disturbance. The patient is not nervous/anxious.       Objective:  BP 124/72 (BP Location: Left Arm, Patient Position: Sitting, Cuff Size: Normal)   Pulse 82   Temp 98.8 F (37.1 C) (Oral)   Ht 6\' 1"  (1.854 m)   Wt 188 lb 6 oz (85.4 kg)   SpO2 98%   BMI 24.85 kg/m   BP Readings from Last 3 Encounters:  04/01/19 124/72  08/27/18 120/72  02/25/18 128/82    Wt Readings from Last 3 Encounters:  04/01/19 188 lb 6 oz (85.4 kg)  08/27/18 192 lb (87.1 kg)  02/25/18 197 lb (89.4 kg)    Physical Exam Constitutional:      General: He is not in acute distress.    Appearance: He is well-developed.     Comments: NAD  Eyes:     Conjunctiva/sclera: Conjunctivae normal.     Pupils: Pupils are equal, round, and reactive to light.  Neck:     Thyroid: No thyromegaly.     Vascular: No JVD.  Cardiovascular:     Rate and Rhythm: Normal rate and regular rhythm.     Heart sounds: Normal heart sounds. No murmur. No friction rub. No gallop.   Pulmonary:     Effort: Pulmonary effort is normal. No respiratory distress.     Breath sounds: Normal breath sounds. No wheezing or rales.  Chest:     Chest wall: No tenderness.  Abdominal:     General: Bowel sounds are normal. There is no distension.     Palpations: Abdomen is soft.  There is no mass.     Tenderness: There is no abdominal tenderness. There is no guarding or rebound.  Musculoskeletal:        General: No tenderness. Normal range of motion.     Cervical back: Normal range of motion.  Lymphadenopathy:     Cervical: No cervical adenopathy.  Skin:    General: Skin is warm and dry.     Findings: No rash.  Neurological:     Mental Status: He is alert and oriented to person, place, and time.     Cranial Nerves: No cranial nerve deficit.     Motor: No abnormal muscle tone.     Coordination: Coordination normal.     Gait: Gait normal.     Deep Tendon Reflexes: Reflexes are normal and symmetric.  Psychiatric:        Behavior: Behavior normal.        Thought Content: Thought content  normal.        Judgment: Judgment normal.   LS tender Good ROM B hips - stiff, NT w/decr ROM  Lab Results  Component Value Date   WBC 4.8 02/25/2018   HGB 16.2 02/25/2018   HCT 45.4 02/25/2018   PLT 215.0 02/25/2018   GLUCOSE 87 02/25/2018   CHOL 207 (H) 02/25/2018   TRIG 109.0 02/25/2018   HDL 54.40 02/25/2018   LDLDIRECT 153.5 09/20/2011   LDLCALC 130 (H) 02/25/2018   ALT 21 02/25/2018   AST 18 02/25/2018   NA 140 02/25/2018   K 4.0 02/25/2018   CL 105 02/25/2018   CREATININE 0.94 02/25/2018   BUN 23 02/25/2018   CO2 26 02/25/2018   TSH 1.27 02/25/2018   PSA 0.30 02/25/2018   HGBA1C 5.3 03/07/2011    CT CARDIAC SCORING  Addendum Date: 07/23/2018   ADDENDUM REPORT: 07/23/2018 12:01 CLINICAL DATA:  Risk stratification EXAM: Coronary Calcium Score TECHNIQUE: The patient was scanned on a Siemens Somatom 64 slice scanner. Axial non-contrast 3 mm slices were carried out through the heart. The data set was analyzed on a dedicated work station and scored using the [place]. FINDINGS: Non-cardiac: See separate report from [facility]. Ascending aorta: Normal diameter 3.5 cm Pericardium: Normal Coronary arteries: No calcium noted IMPRESSION: Coronary calcium score of 0. Jenkins Rouge Electronically Signed   By: Jenkins Rouge M.D.   On: 07/23/2018 12:01   Result Date: 07/23/2018 EXAM: OVER-READ INTERPRETATION  CT CHEST The following report is an over-read performed by radiologist Dr. Rolm Baptise of [facility], [place] on 07/23/2018. This over-read does not include interpretation of cardiac or coronary anatomy or pathology. The coronary calcium score interpretation by the cardiologist is attached. COMPARISON:  None. FINDINGS: Vascular: Scattered calcifications in the descending thoracic aorta. Heart is normal size. Visualized aorta normal caliber. Mediastinum/Nodes: No adenopathy in the lower mediastinum or hila. Lungs/Pleura: No confluent opacities or effusions. Upper  Abdomen: Imaging into the upper abdomen shows no acute findings. Musculoskeletal: Chest wall soft tissues are unremarkable. No acute bony abnormality. IMPRESSION: No acute extra cardiac abnormality. Scattered descending aortic atherosclerosis. Electronically Signed: By: Rolm Baptise M.D. On: 07/23/2018 10:58    Assessment & Plan:   There are no diagnoses linked to this encounter.   No orders of the defined types were placed in this encounter.    Follow-up: No follow-ups on file.  Walker Kehr, MD

## 2019-04-01 NOTE — Assessment & Plan Note (Addendum)
We discussed age appropriate health related issues, including available/recomended screening tests and vaccinations. We discussed a need for adhering to healthy diet and exercise. Labs were ordered to be later reviewed . All questions were answered. Coronary calcium score of 0 - 2020. Cologuard 2018 (-) 

## 2019-04-01 NOTE — Assessment & Plan Note (Signed)
Will not use NSAIDs

## 2019-04-03 ENCOUNTER — Encounter: Payer: BC Managed Care – PPO | Admitting: Internal Medicine

## 2019-04-03 ENCOUNTER — Other Ambulatory Visit: Payer: BC Managed Care – PPO

## 2019-04-08 ENCOUNTER — Other Ambulatory Visit (INDEPENDENT_AMBULATORY_CARE_PROVIDER_SITE_OTHER): Payer: BC Managed Care – PPO

## 2019-04-08 ENCOUNTER — Ambulatory Visit (INDEPENDENT_AMBULATORY_CARE_PROVIDER_SITE_OTHER): Payer: BC Managed Care – PPO

## 2019-04-08 DIAGNOSIS — Z125 Encounter for screening for malignant neoplasm of prostate: Secondary | ICD-10-CM

## 2019-04-08 DIAGNOSIS — M25551 Pain in right hip: Secondary | ICD-10-CM

## 2019-04-08 DIAGNOSIS — M545 Low back pain, unspecified: Secondary | ICD-10-CM

## 2019-04-08 DIAGNOSIS — Z0000 Encounter for general adult medical examination without abnormal findings: Secondary | ICD-10-CM | POA: Diagnosis not present

## 2019-04-08 DIAGNOSIS — G8929 Other chronic pain: Secondary | ICD-10-CM | POA: Diagnosis not present

## 2019-04-08 DIAGNOSIS — M1611 Unilateral primary osteoarthritis, right hip: Secondary | ICD-10-CM | POA: Diagnosis not present

## 2019-04-08 LAB — BASIC METABOLIC PANEL
BUN: 24 mg/dL — ABNORMAL HIGH (ref 6–23)
CO2: 32 mEq/L (ref 19–32)
Calcium: 9.9 mg/dL (ref 8.4–10.5)
Chloride: 102 mEq/L (ref 96–112)
Creatinine, Ser: 0.95 mg/dL (ref 0.40–1.50)
GFR: 79.88 mL/min (ref >60.00)
Glucose, Bld: 76 mg/dL (ref 70–99)
Potassium: 3.5 mEq/L (ref 3.5–5.1)
Sodium: 142 mEq/L (ref 135–145)

## 2019-04-08 LAB — CBC WITH DIFFERENTIAL/PLATELET
Basophils Absolute: 0 10*3/uL (ref 0.0–0.1)
Basophils Relative: 0.2 % (ref 0.0–3.0)
Eosinophils Absolute: 0.1 10*3/uL (ref 0.0–0.7)
Eosinophils Relative: 1.5 % (ref 0.0–5.0)
HCT: 45.3 % (ref 39.0–52.0)
Hemoglobin: 16 g/dL (ref 13.0–17.0)
Lymphocytes Relative: 27.5 % (ref 12.0–46.0)
Lymphs Abs: 1.5 10*3/uL (ref 0.7–4.0)
MCHC: 35.3 g/dL (ref 30.0–36.0)
MCV: 92.3 fl (ref 78.0–100.0)
Monocytes Absolute: 0.5 10*3/uL (ref 0.1–1.0)
Monocytes Relative: 9.3 % (ref 3.0–12.0)
Neutro Abs: 3.4 10*3/uL (ref 1.4–7.7)
Neutrophils Relative %: 61.5 % (ref 43.0–77.0)
Platelets: 225 10*3/uL (ref 150.0–400.0)
RBC: 4.91 Mil/uL (ref 4.22–5.81)
RDW: 12.2 % (ref 11.5–15.5)
WBC: 5.6 10*3/uL (ref 4.0–10.5)

## 2019-04-08 LAB — URINALYSIS
Bilirubin Urine: NEGATIVE
Hgb urine dipstick: NEGATIVE
Ketones, ur: NEGATIVE
Leukocytes,Ua: NEGATIVE
Nitrite: NEGATIVE
Specific Gravity, Urine: 1.02 (ref 1.000–1.030)
Total Protein, Urine: NEGATIVE
Urine Glucose: NEGATIVE
Urobilinogen, UA: 1 (ref 0.0–1.0)
pH: 6.5 (ref 5.0–8.0)

## 2019-04-08 LAB — LIPID PANEL
Cholesterol: 211 mg/dL — ABNORMAL HIGH (ref 0–200)
HDL: 52.5 mg/dL (ref >39.00)
LDL Cholesterol: 137 mg/dL — ABNORMAL HIGH (ref 0–99)
NonHDL: 158.82
Total CHOL/HDL Ratio: 4
Triglycerides: 111 mg/dL (ref 0.0–149.0)
VLDL: 22.2 mg/dL (ref 0.0–40.0)

## 2019-04-08 LAB — HEPATIC FUNCTION PANEL
ALT: 17 U/L (ref 0–53)
AST: 13 U/L (ref 0–37)
Albumin: 4.4 g/dL (ref 3.5–5.2)
Alkaline Phosphatase: 62 U/L (ref 39–117)
Bilirubin, Direct: 0.1 mg/dL (ref 0.0–0.3)
Total Bilirubin: 0.7 mg/dL (ref 0.2–1.2)
Total Protein: 6.7 g/dL (ref 6.0–8.3)

## 2019-04-08 LAB — PSA: PSA: 0.15 ng/mL (ref 0.10–4.00)

## 2019-04-08 LAB — TSH: TSH: 0.94 u[IU]/mL (ref 0.35–4.50)

## 2019-04-08 LAB — VITAMIN B12: Vitamin B-12: >1500 pg/mL — ABNORMAL HIGH (ref 211–911)

## 2019-04-08 LAB — VITAMIN D 25 HYDROXY (VIT D DEFICIENCY, FRACTURES): VITD: 45.05 ng/mL (ref 30.00–100.00)

## 2019-05-01 ENCOUNTER — Other Ambulatory Visit: Payer: Self-pay

## 2019-05-01 ENCOUNTER — Ambulatory Visit: Payer: BC Managed Care – PPO | Admitting: Internal Medicine

## 2019-05-01 DIAGNOSIS — Z0289 Encounter for other administrative examinations: Secondary | ICD-10-CM

## 2019-05-29 DIAGNOSIS — H2513 Age-related nuclear cataract, bilateral: Secondary | ICD-10-CM | POA: Diagnosis not present

## 2019-05-29 DIAGNOSIS — H18413 Arcus senilis, bilateral: Secondary | ICD-10-CM | POA: Diagnosis not present

## 2019-05-29 DIAGNOSIS — H25043 Posterior subcapsular polar age-related cataract, bilateral: Secondary | ICD-10-CM | POA: Diagnosis not present

## 2019-05-29 DIAGNOSIS — H25013 Cortical age-related cataract, bilateral: Secondary | ICD-10-CM | POA: Diagnosis not present

## 2019-06-06 ENCOUNTER — Ambulatory Visit: Payer: BC Managed Care – PPO | Admitting: Internal Medicine

## 2019-06-24 ENCOUNTER — Other Ambulatory Visit: Payer: Self-pay

## 2019-06-24 ENCOUNTER — Ambulatory Visit: Payer: BC Managed Care – PPO | Admitting: Internal Medicine

## 2019-06-24 ENCOUNTER — Encounter: Payer: Self-pay | Admitting: Internal Medicine

## 2019-06-24 DIAGNOSIS — G8929 Other chronic pain: Secondary | ICD-10-CM

## 2019-06-24 DIAGNOSIS — Z905 Acquired absence of kidney: Secondary | ICD-10-CM | POA: Diagnosis not present

## 2019-06-24 DIAGNOSIS — M545 Low back pain, unspecified: Secondary | ICD-10-CM

## 2019-06-24 DIAGNOSIS — M1990 Unspecified osteoarthritis, unspecified site: Secondary | ICD-10-CM | POA: Diagnosis not present

## 2019-06-24 MED ORDER — ACETAMINOPHEN 500 MG PO TABS: 500.0000 mg | ORAL_TABLET | Freq: Three times a day (TID) | ORAL | 3 refills | Status: AC | PRN

## 2019-06-24 NOTE — Patient Instructions (Signed)
Yoga class for back pain

## 2019-06-24 NOTE — Assessment & Plan Note (Signed)
Stretch  

## 2019-06-24 NOTE — Progress Notes (Signed)
Subjective:  Patient ID: Calvin Burke, male    DOB: 1955-12-22  Age: 64 y.o. MRN: [id number]  CC: No chief complaint on file.   HPI ZYIAN BROKENSHIRE presents for R hip pain; OA, LBP and stiffness   Outpatient Medications Prior to Visit  Medication Sig Dispense Refill  . ALPRAZolam (XANAX) 0.5 MG tablet Take 0.5 mg by mouth at bedtime as needed.    Marland Kitchen aspirin 81 MG EC tablet TAKE 1 TABLET (81 MG TOTAL) BY MOUTH DAILY. 90 tablet 0  . Cholecalciferol (VITAMIN D3) 2000 units capsule Take 1 capsule (2,000 Units total) by mouth daily. 100 capsule 3  . COLLAGEN PO Take by mouth.    . finasteride (PROPECIA) 1 MG tablet Take 1 mg by mouth daily.    . Omega-3 Fatty Acids (FISH OIL) 1000 MG CAPS Take by mouth.    . sildenafil (VIAGRA) 100 MG tablet Take 1 tablet (100 mg total) by mouth as needed for erectile dysfunction. 12 tablet 11  . vitamin B-12 (CYANOCOBALAMIN) 1000 MCG tablet Take 1,000 mcg by mouth daily.    . methylPREDNISolone (MEDROL DOSEPAK) 4 MG TBPK tablet As directed (Patient not taking: Reported on 06/24/2019) 21 tablet 0   No facility-administered medications prior to visit.    ROS: Review of Systems  Constitutional: Negative for appetite change, fatigue and unexpected weight change.  HENT: Negative for congestion, nosebleeds, sneezing, sore throat and trouble swallowing.   Eyes: Negative for itching and visual disturbance.  Respiratory: Negative for cough.   Cardiovascular: Negative for chest pain, palpitations and leg swelling.  Gastrointestinal: Negative for abdominal distention, blood in stool, diarrhea and nausea.  Genitourinary: Negative for frequency and hematuria.  Musculoskeletal: Positive for arthralgias, back pain, neck pain and neck stiffness. Negative for gait problem and joint swelling.  Skin: Negative for rash.  Neurological: Negative for dizziness, tremors, speech difficulty and weakness.  Psychiatric/Behavioral: Negative for agitation, dysphoric mood and  sleep disturbance. The patient is not nervous/anxious.     Objective:  BP 122/72 (BP Location: Left Arm, Patient Position: Sitting, Cuff Size: Large)   Pulse 69   Temp 98.5 F (36.9 C) (Oral)   Ht 6\' 1"  (1.854 m)   Wt 187 lb (84.8 kg)   SpO2 95%   BMI 24.67 kg/m   BP Readings from Last 3 Encounters:  06/24/19 122/72  04/01/19 124/72  08/27/18 120/72    Wt Readings from Last 3 Encounters:  06/24/19 187 lb (84.8 kg)  04/01/19 188 lb 6 oz (85.4 kg)  08/27/18 192 lb (87.1 kg)    Physical Exam Constitutional:      General: He is not in acute distress.    Appearance: He is well-developed.     Comments: NAD  Eyes:     Conjunctiva/sclera: Conjunctivae normal.     Pupils: Pupils are equal, round, and reactive to light.  Neck:     Thyroid: No thyromegaly.     Vascular: No JVD.  Cardiovascular:     Rate and Rhythm: Normal rate and regular rhythm.     Heart sounds: Normal heart sounds. No murmur. No friction rub. No gallop.   Pulmonary:     Effort: Pulmonary effort is normal. No respiratory distress.     Breath sounds: Normal breath sounds. No wheezing or rales.  Chest:     Chest wall: No tenderness.  Abdominal:     General: Bowel sounds are normal. There is no distension.     Palpations: Abdomen is soft.  There is no mass.     Tenderness: There is no abdominal tenderness. There is no guarding or rebound.  Musculoskeletal:        General: No tenderness. Normal range of motion.     Cervical back: Normal range of motion.  Lymphadenopathy:     Cervical: No cervical adenopathy.  Skin:    General: Skin is warm and dry.     Findings: No rash.  Neurological:     Mental Status: He is alert and oriented to person, place, and time.     Cranial Nerves: No cranial nerve deficit.     Motor: No abnormal muscle tone.     Coordination: Coordination normal.     Gait: Gait normal.     Deep Tendon Reflexes: Reflexes are normal and symmetric.  Psychiatric:        Behavior: Behavior  normal.        Thought Content: Thought content normal.        Judgment: Judgment normal.   FTF >30 min  Lab Results  Component Value Date   WBC 5.6 04/08/2019   HGB 16.0 04/08/2019   HCT 45.3 04/08/2019   PLT 225.0 04/08/2019   GLUCOSE 76 04/08/2019   CHOL 211 (H) 04/08/2019   TRIG 111.0 04/08/2019   HDL 52.50 04/08/2019   LDLDIRECT 153.5 09/20/2011   LDLCALC 137 (H) 04/08/2019   ALT 17 04/08/2019   AST 13 04/08/2019   NA 142 04/08/2019   K 3.5 04/08/2019   CL 102 04/08/2019   CREATININE 0.95 04/08/2019   BUN 24 (H) 04/08/2019   CO2 32 04/08/2019   TSH 0.94 04/08/2019   PSA 0.15 04/08/2019   HGBA1C 5.3 03/07/2011    CT CARDIAC SCORING  Addendum Date: 07/23/2018   ADDENDUM REPORT: 07/23/2018 12:01 CLINICAL DATA:  Risk stratification EXAM: Coronary Calcium Score TECHNIQUE: The patient was scanned on a Siemens Somatom 64 slice scanner. Axial non-contrast 3 mm slices were carried out through the heart. The data set was analyzed on a dedicated work station and scored using the [place]. FINDINGS: Non-cardiac: See separate report from [facility]. Ascending aorta: Normal diameter 3.5 cm Pericardium: Normal Coronary arteries: No calcium noted IMPRESSION: Coronary calcium score of 0. Jenkins Rouge Electronically Signed   By: Jenkins Rouge M.D.   On: 07/23/2018 12:01   Result Date: 07/23/2018 EXAM: OVER-READ INTERPRETATION  CT CHEST The following report is an over-read performed by radiologist Dr. Rolm Baptise of [facility], [place] on 07/23/2018. This over-read does not include interpretation of cardiac or coronary anatomy or pathology. The coronary calcium score interpretation by the cardiologist is attached. COMPARISON:  None. FINDINGS: Vascular: Scattered calcifications in the descending thoracic aorta. Heart is normal size. Visualized aorta normal caliber. Mediastinum/Nodes: No adenopathy in the lower mediastinum or hila. Lungs/Pleura: No confluent opacities or  effusions. Upper Abdomen: Imaging into the upper abdomen shows no acute findings. Musculoskeletal: Chest wall soft tissues are unremarkable. No acute bony abnormality. IMPRESSION: No acute extra cardiac abnormality. Scattered descending aortic atherosclerosis. Electronically Signed: By: Rolm Baptise M.D. On: 07/23/2018 10:58    Assessment & Plan:   There are no diagnoses linked to this encounter.   No orders of the defined types were placed in this encounter.    Follow-up: No follow-ups on file.  Walker Kehr, MD

## 2019-06-25 ENCOUNTER — Encounter: Payer: Self-pay | Admitting: Internal Medicine

## 2019-06-25 DIAGNOSIS — M1990 Unspecified osteoarthritis, unspecified site: Secondary | ICD-10-CM | POA: Insufficient documentation

## 2019-06-25 NOTE — Assessment & Plan Note (Signed)
Avoid NSAIDs Monitor BMET

## 2019-06-25 NOTE — Assessment & Plan Note (Signed)
Vit D daily ES Tylenol prn Yoga

## 2019-10-16 ENCOUNTER — Other Ambulatory Visit: Payer: Self-pay | Admitting: Internal Medicine

## 2019-12-25 ENCOUNTER — Ambulatory Visit: Payer: BC Managed Care – PPO | Admitting: Internal Medicine

## 2019-12-25 ENCOUNTER — Other Ambulatory Visit: Payer: Self-pay

## 2019-12-25 ENCOUNTER — Encounter: Payer: Self-pay | Admitting: Internal Medicine

## 2019-12-25 DIAGNOSIS — M1990 Unspecified osteoarthritis, unspecified site: Secondary | ICD-10-CM

## 2019-12-25 DIAGNOSIS — M545 Low back pain, unspecified: Secondary | ICD-10-CM

## 2019-12-25 DIAGNOSIS — G8929 Other chronic pain: Secondary | ICD-10-CM | POA: Diagnosis not present

## 2019-12-25 DIAGNOSIS — F419 Anxiety disorder, unspecified: Secondary | ICD-10-CM

## 2019-12-25 NOTE — Assessment & Plan Note (Addendum)
Labs Continue with Tylenol.  Safe Tylenol single dose and maximum daily dose were discussed

## 2019-12-25 NOTE — Progress Notes (Signed)
Subjective:  Patient ID: Calvin Burke, male    DOB: [birth year]  Age: 64 y.o. MRN: [id number]  CC: Diabetes (6 MONTHS F/U)   HPI Calvin Burke presents for OA.  Using Tylenol as needed.  Follow-up on anxiety  Outpatient Medications Prior to Visit  Medication Sig Dispense Refill  . acetaminophen (TYLENOL) 500 MG tablet Take 1 tablet (500 mg total) by mouth every 8 (eight) hours as needed for moderate pain. 100 tablet 3  . ALPRAZolam (XANAX) 0.5 MG tablet Take 0.5 mg by mouth at bedtime as needed.    Marland Kitchen aspirin 81 MG EC tablet TAKE 1 TABLET (81 MG TOTAL) BY MOUTH DAILY. 90 tablet 0  . Cholecalciferol (VITAMIN D3) 2000 units capsule Take 1 capsule (2,000 Units total) by mouth daily. 100 capsule 3  . COLLAGEN PO Take by mouth.    . finasteride (PROPECIA) 1 MG tablet Take 1 mg by mouth daily.    . Omega-3 Fatty Acids (FISH OIL) 1000 MG CAPS Take by mouth.    . sildenafil (VIAGRA) 100 MG tablet TAKE 1 TABLET BY MOUTH EVERY DAY AS NEEDED FOR ERECTILE DYSFUNCTION 12 tablet 11  . vitamin B-12 (CYANOCOBALAMIN) 1000 MCG tablet Take 1,000 mcg by mouth daily.     No facility-administered medications prior to visit.    ROS: Review of Systems  Constitutional: Negative for appetite change, fatigue and unexpected weight change.  HENT: Negative for congestion, nosebleeds, sneezing, sore throat and trouble swallowing.   Eyes: Negative for itching and visual disturbance.  Respiratory: Negative for cough.   Cardiovascular: Negative for chest pain, palpitations and leg swelling.  Gastrointestinal: Negative for abdominal distention, blood in stool, diarrhea and nausea.  Genitourinary: Negative for frequency and hematuria.  Musculoskeletal: Positive for arthralgias. Negative for back pain, gait problem, joint swelling and neck pain.  Skin: Negative for rash.  Neurological: Negative for dizziness, tremors, speech difficulty and weakness.  Psychiatric/Behavioral: Negative for agitation, dysphoric  mood and sleep disturbance. The patient is not nervous/anxious.     Objective:  BP 120/72 (BP Location: Left Arm)   Pulse 73   Temp 98.9 F (37.2 C) (Oral)   Wt 189 lb 6.4 oz (85.9 kg)   SpO2 95%   BMI 24.99 kg/m   BP Readings from Last 3 Encounters:  12/25/19 120/72  06/24/19 122/72  04/01/19 124/72    Wt Readings from Last 3 Encounters:  12/25/19 189 lb 6.4 oz (85.9 kg)  06/24/19 187 lb (84.8 kg)  04/01/19 188 lb 6 oz (85.4 kg)    Physical Exam Constitutional:      General: He is not in acute distress.    Appearance: He is well-developed.     Comments: NAD  Eyes:     Conjunctiva/sclera: Conjunctivae normal.     Pupils: Pupils are equal, round, and reactive to light.  Neck:     Thyroid: No thyromegaly.     Vascular: No JVD.  Cardiovascular:     Rate and Rhythm: Normal rate and regular rhythm.     Heart sounds: Normal heart sounds. No murmur heard.  No friction rub. No gallop.   Pulmonary:     Effort: Pulmonary effort is normal. No respiratory distress.     Breath sounds: Normal breath sounds. No wheezing or rales.  Chest:     Chest wall: No tenderness.  Abdominal:     General: Bowel sounds are normal. There is no distension.     Palpations: Abdomen is soft. There is no  mass.     Tenderness: There is no abdominal tenderness. There is no guarding or rebound.  Musculoskeletal:        General: No tenderness. Normal range of motion.     Cervical back: Normal range of motion.  Lymphadenopathy:     Cervical: No cervical adenopathy.  Skin:    General: Skin is warm and dry.     Findings: No rash.  Neurological:     Mental Status: He is alert and oriented to person, place, and time.     Cranial Nerves: No cranial nerve deficit.     Motor: No abnormal muscle tone.     Coordination: Coordination normal.     Gait: Gait normal.     Deep Tendon Reflexes: Reflexes are normal and symmetric.  Psychiatric:        Behavior: Behavior normal.        Thought Content:  Thought content normal.        Judgment: Judgment normal.     Lab Results  Component Value Date   WBC 5.6 04/08/2019   HGB 16.0 04/08/2019   HCT 45.3 04/08/2019   PLT 225.0 04/08/2019   GLUCOSE 76 04/08/2019   CHOL 211 (H) 04/08/2019   TRIG 111.0 04/08/2019   HDL 52.50 04/08/2019   LDLDIRECT 153.5 09/20/2011   LDLCALC 137 (H) 04/08/2019   ALT 17 04/08/2019   AST 13 04/08/2019   NA 142 04/08/2019   K 3.5 04/08/2019   CL 102 04/08/2019   CREATININE 0.95 04/08/2019   BUN 24 (H) 04/08/2019   CO2 32 04/08/2019   TSH 0.94 04/08/2019   PSA 0.15 04/08/2019   HGBA1C 5.3 03/07/2011    CT CARDIAC SCORING  Addendum Date: 07/23/2018   ADDENDUM REPORT: 07/23/2018 12:01 CLINICAL DATA:  Risk stratification EXAM: Coronary Calcium Score TECHNIQUE: The patient was scanned on a Siemens Somatom 64 slice scanner. Axial non-contrast 3 mm slices were carried out through the heart. The data set was analyzed on a dedicated work station and scored using the [place]. FINDINGS: Non-cardiac: See separate report from [facility]. Ascending aorta: Normal diameter 3.5 cm Pericardium: Normal Coronary arteries: No calcium noted IMPRESSION: Coronary calcium score of 0. Calvin Burke Electronically Signed   By: Calvin Burke M.D.   On: 07/23/2018 12:01   Result Date: 07/23/2018 EXAM: OVER-READ INTERPRETATION  CT CHEST The following report is an over-read performed by radiologist Calvin Burke of [facility], [place] on 07/23/2018. This over-read does not include interpretation of cardiac or coronary anatomy or pathology. The coronary calcium score interpretation by the cardiologist is attached. COMPARISON:  None. FINDINGS: Vascular: Scattered calcifications in the descending thoracic aorta. Heart is normal size. Visualized aorta normal caliber. Mediastinum/Nodes: No adenopathy in the lower mediastinum or hila. Lungs/Pleura: No confluent opacities or effusions. Upper Abdomen: Imaging into the upper  abdomen shows no acute findings. Musculoskeletal: Chest wall soft tissues are unremarkable. No acute bony abnormality. IMPRESSION: No acute extra cardiac abnormality. Scattered descending aortic atherosclerosis. Electronically Signed: By: Rolm Burke M.D. On: 07/23/2018 10:58    Assessment & Plan:   There are no diagnoses linked to this encounter.   No orders of the defined types were placed in this encounter.    Follow-up: No follow-ups on file.  Walker Kehr, MD

## 2019-12-28 DIAGNOSIS — F419 Anxiety disorder, unspecified: Secondary | ICD-10-CM | POA: Insufficient documentation

## 2019-12-28 NOTE — Assessment & Plan Note (Signed)
Alprazolam as needed

## 2019-12-28 NOTE — Assessment & Plan Note (Signed)
Continue with Tylenol.  Safe Tylenol single dose and maximum daily dose were discussed

## 2020-03-29 IMAGING — CT CT HEART SCORING
2 series · 16 of 20 positions shown, 18 images · non-contrast
Comparison: None.
COMPARISON: None.

Addendum:
EXAM:
OVER-READ INTERPRETATION  CT CHEST

The following report is an over-read performed by radiologist Dr.
Naum Wiebe [REDACTED] on 07/23/2018. This over-read
does not include interpretation of cardiac or coronary anatomy or
pathology. The coronary calcium score interpretation by the
cardiologist is attached.
CLINICAL DATA: Risk stratification
Coronary Calcium Score
TECHNIQUE: The patient was scanned on a Siemens Somatom 64 slice scanner. Axial
non-contrast 3 mm slices were carried out through the heart. The
data set was analyzed on a dedicated work station and scored using
the [place] method.

[Series 2: casc 3.0 i36f 2 bestdiast 68 % · axial · 0.38mm/px · z∈[-283,-166]mm · 8 of 51 slices shown, 10 images]
[im 6/51  vessel]
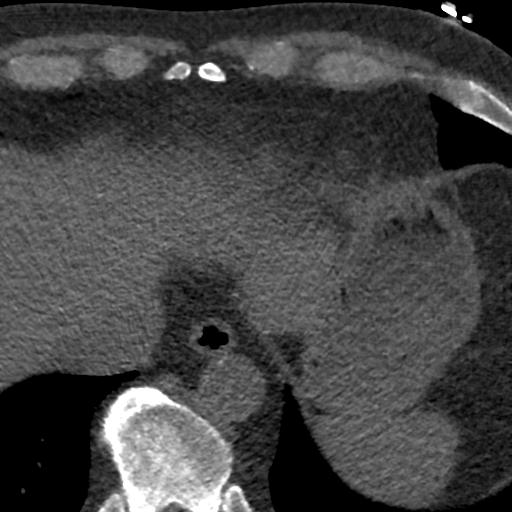
[im 6/51  lung]
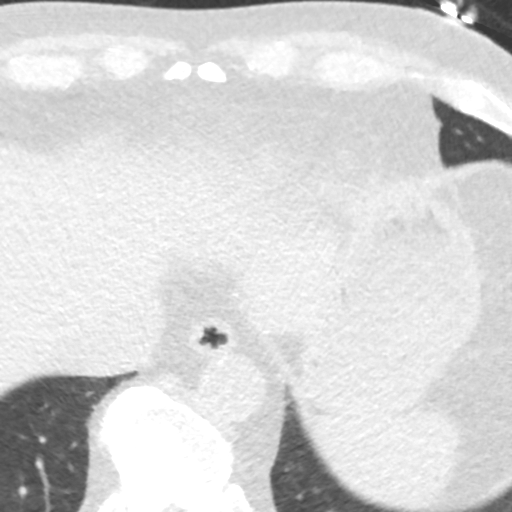
[im 12/51  vessel]
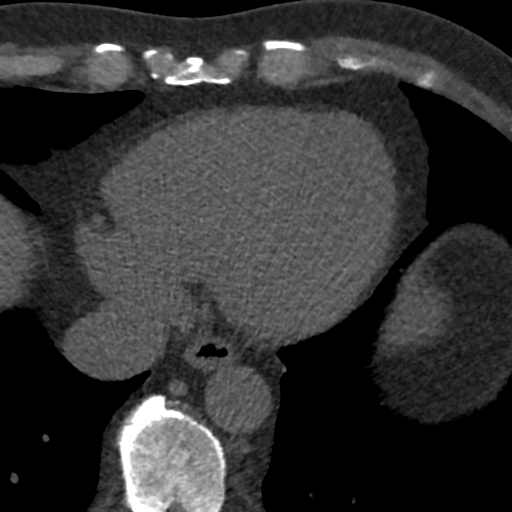
[im 17/51  vessel]
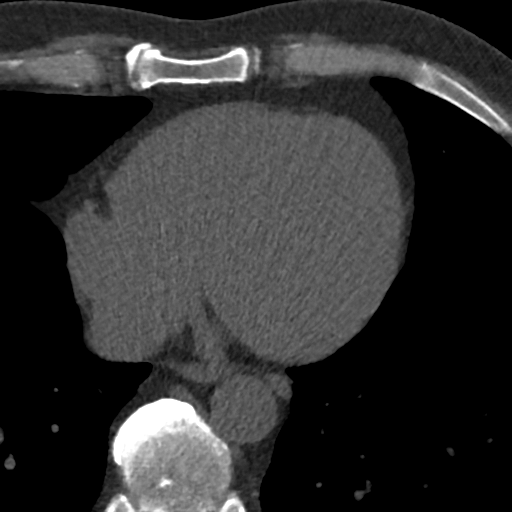
[im 23/51  vessel]
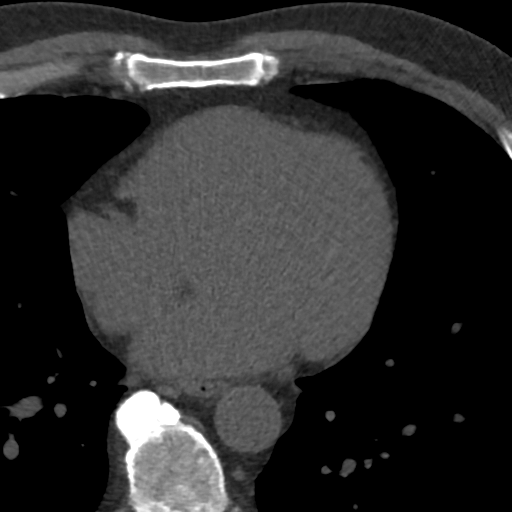
[im 28/51  vessel]
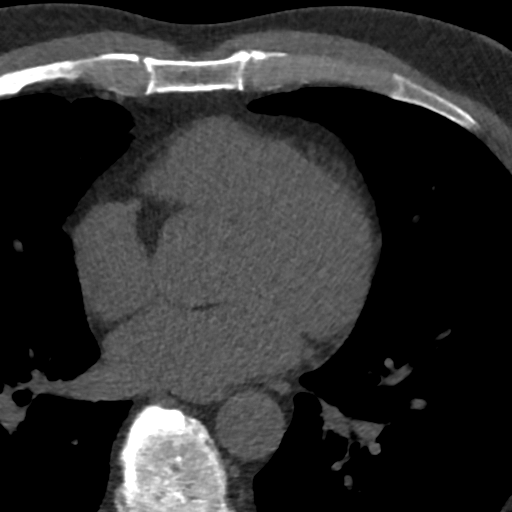
[im 28/51  lung]
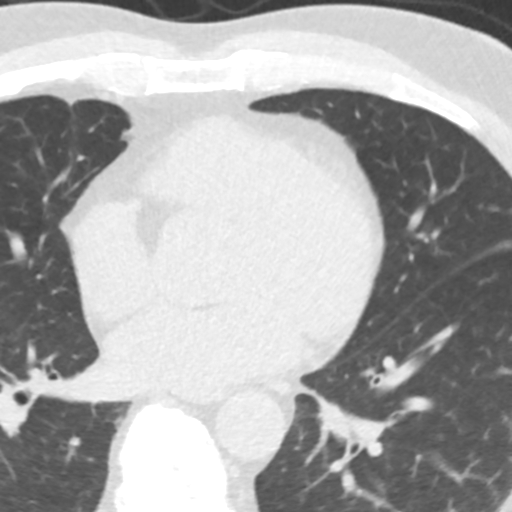
[im 34/51  vessel]
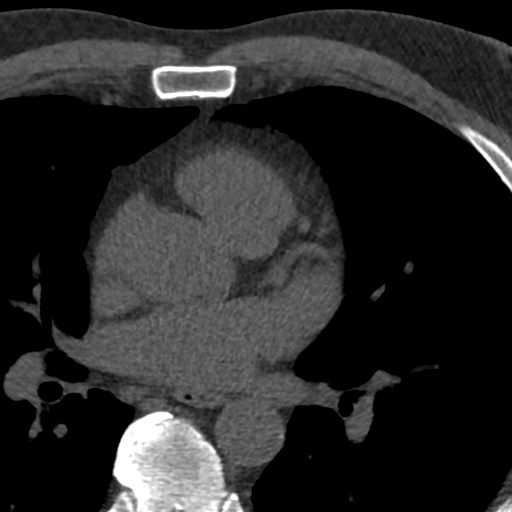
[im 39/51  vessel]
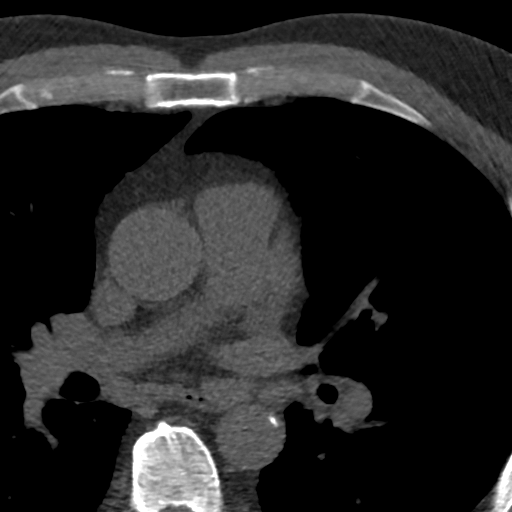
[im 45/51  vessel]
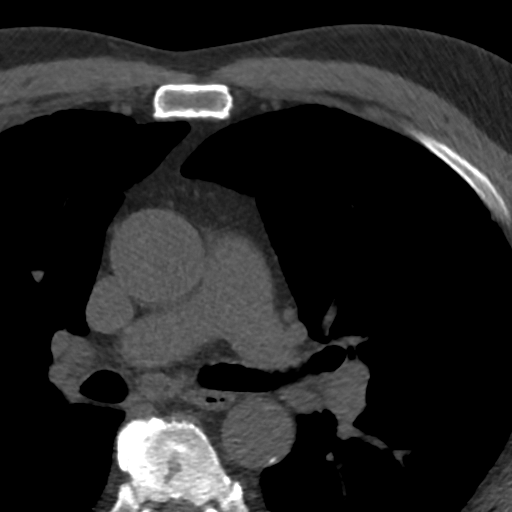

[Series 4: lung st 69 % · axial · 0.68mm/px · z∈[-283,-166]mm · 8 of 51 slices shown]
[im 6/51  lung]
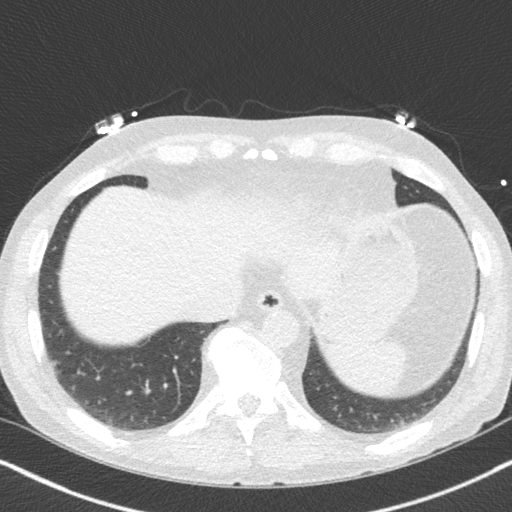
[im 12/51  lung]
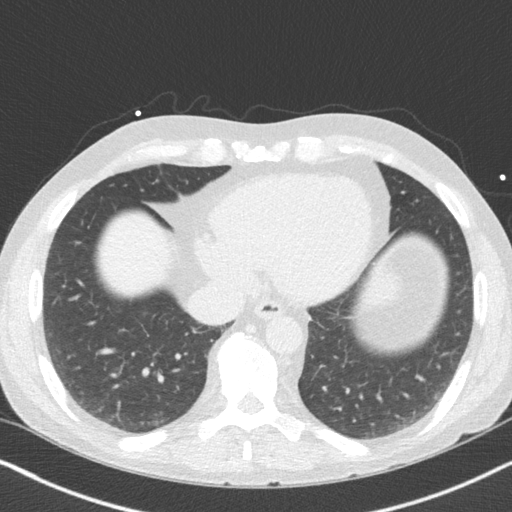
[im 17/51  lung]
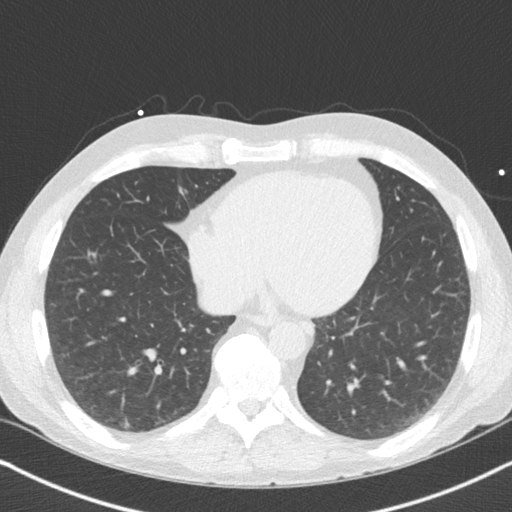
[im 23/51  lung]
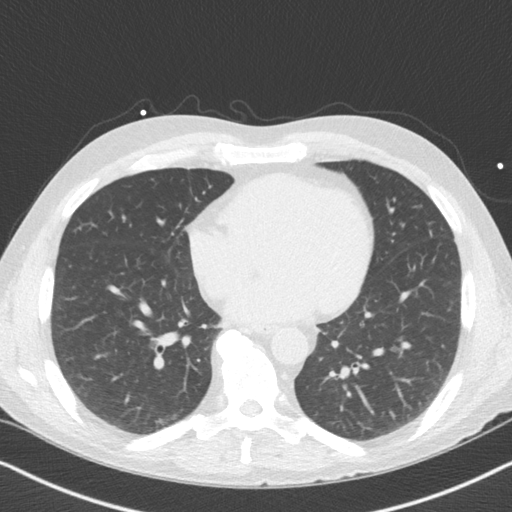
[im 28/51  lung]
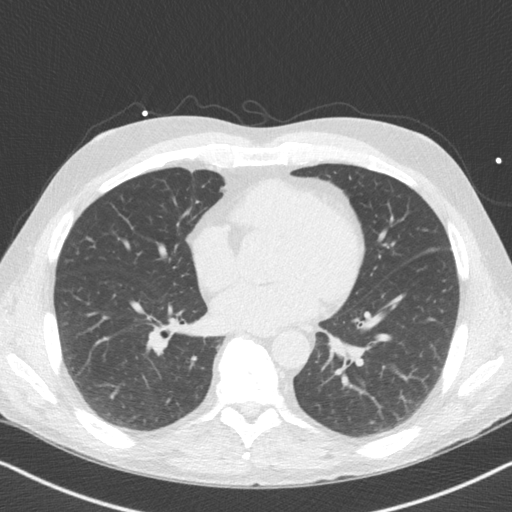
[im 34/51  lung]
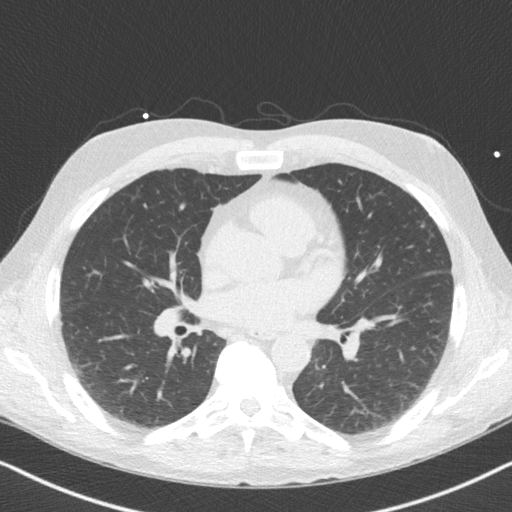
[im 39/51  lung]
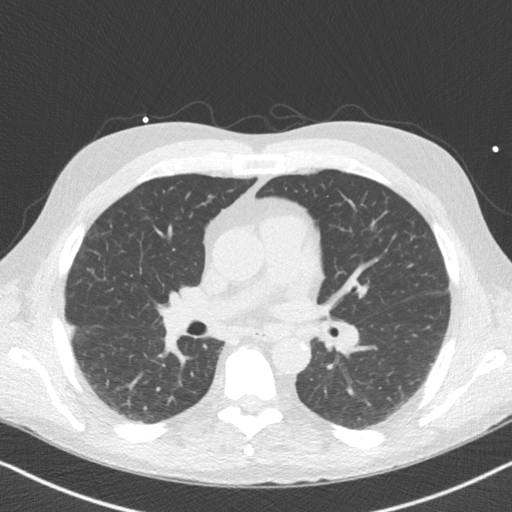
[im 45/51  lung]
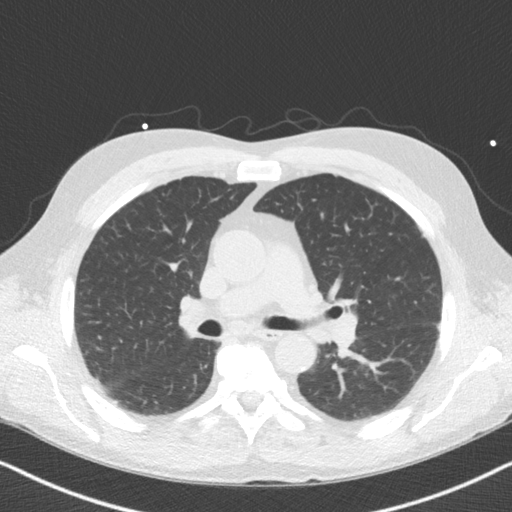

[16 of 20 positions shown; findings below may reference images not displayed]

FINDINGS: Vascular: Scattered calcifications in the descending thoracic aorta.
Heart is normal size. Visualized aorta normal caliber.

Mediastinum/Nodes: No adenopathy in the lower mediastinum or hila.

Lungs/Pleura: No confluent opacities or effusions.

Upper Abdomen: Imaging into the upper abdomen shows no acute
findings.

Musculoskeletal: Chest wall soft tissues are unremarkable. No acute
bony abnormality.
IMPRESSION: No acute extra cardiac abnormality.

Scattered descending aortic atherosclerosis.
FINDINGS: Non-cardiac: See separate report from [REDACTED].

Ascending aorta: Normal diameter 3.5 cm

Pericardium: Normal

Coronary arteries: No calcium noted
IMPRESSION: Coronary calcium score of 0.

Katelynn Denmark

*** End of Addendum ***
EXAM:
OVER-READ INTERPRETATION  CT CHEST

The following report is an over-read performed by radiologist Dr.
Naum Wiebe [REDACTED] on 07/23/2018. This over-read
does not include interpretation of cardiac or coronary anatomy or
pathology. The coronary calcium score interpretation by the
cardiologist is attached.
FINDINGS: Vascular: Scattered calcifications in the descending thoracic aorta.
Heart is normal size. Visualized aorta normal caliber.

Mediastinum/Nodes: No adenopathy in the lower mediastinum or hila.

Lungs/Pleura: No confluent opacities or effusions.

Upper Abdomen: Imaging into the upper abdomen shows no acute
findings.

Musculoskeletal: Chest wall soft tissues are unremarkable. No acute
bony abnormality.
IMPRESSION: No acute extra cardiac abnormality.

Scattered descending aortic atherosclerosis.

## 2020-08-05 DIAGNOSIS — L821 Other seborrheic keratosis: Secondary | ICD-10-CM | POA: Diagnosis not present

## 2020-08-05 DIAGNOSIS — L814 Other melanin hyperpigmentation: Secondary | ICD-10-CM | POA: Diagnosis not present

## 2020-08-05 DIAGNOSIS — D229 Melanocytic nevi, unspecified: Secondary | ICD-10-CM | POA: Diagnosis not present

## 2020-08-05 DIAGNOSIS — L819 Disorder of pigmentation, unspecified: Secondary | ICD-10-CM | POA: Diagnosis not present

## 2020-11-16 ENCOUNTER — Other Ambulatory Visit: Payer: Self-pay

## 2020-11-16 ENCOUNTER — Ambulatory Visit (INDEPENDENT_AMBULATORY_CARE_PROVIDER_SITE_OTHER): Payer: Medicare Other | Admitting: Internal Medicine

## 2020-11-16 ENCOUNTER — Encounter: Payer: Self-pay | Admitting: Internal Medicine

## 2020-11-16 VITALS — BP 122/68 | HR 71 | Temp 98.2°F | Ht 73.0 in | Wt 193.6 lb

## 2020-11-16 DIAGNOSIS — M7062 Trochanteric bursitis, left hip: Secondary | ICD-10-CM

## 2020-11-16 DIAGNOSIS — Z0001 Encounter for general adult medical examination with abnormal findings: Secondary | ICD-10-CM | POA: Diagnosis not present

## 2020-11-16 DIAGNOSIS — Z0000 Encounter for general adult medical examination without abnormal findings: Secondary | ICD-10-CM

## 2020-11-16 DIAGNOSIS — M25552 Pain in left hip: Secondary | ICD-10-CM

## 2020-11-16 MED ORDER — METHYLPREDNISOLONE ACETATE 80 MG/ML IJ SUSP
80.0000 mg | Freq: Once | INTRAMUSCULAR | Status: AC
  Administered 2020-11-16: 80 mg via INTRAMUSCULAR

## 2020-11-16 MED ORDER — LIDOCAINE-EPINEPHRINE 2 %-1:100000 IJ SOLN
3.0000 mL | Freq: Once | INTRAMUSCULAR | Status: AC
  Administered 2020-11-16: 3 mL

## 2020-11-16 NOTE — Assessment & Plan Note (Addendum)
Worse Massage with a spiky ball McKenzie inflatable pillow Will inject with steroids.  See procedure Use Voltaren gel, ice

## 2020-11-16 NOTE — Assessment & Plan Note (Signed)
We discussed age appropriate health related issues, including available/recomended screening tests and vaccinations. We discussed a need for adhering to healthy diet and exercise. Labs were ordered to be later reviewed . All questions were answered. Coronary calcium score of 0 - 2020. Cologuard 2018 (-)

## 2020-11-16 NOTE — Patient Instructions (Addendum)
Massage spiky ball McKenzie inflatable pillow   Postprocedure instructions :    A Band-Aid should be left on for 12 hours. Injection therapy is not a cure itself. It is used in conjunction with other modalities. You can use nonsteroidal anti-inflammatories like ibuprofen , hot and cold compresses. Rest is recommended in the next 24 hours. You need to report immediately  if fever, chills or any signs of infection develop.

## 2020-11-16 NOTE — Progress Notes (Signed)
Subjective:  Patient ID: Calvin Burke, male    DOB: 02/02/1956  Age: 65 y.o. MRN: [id number]  CC: Annual Exam   HPI Calvin Burke presents for a well exam C/o severe L hip pain 12 hours several months duration  Outpatient Medications Prior to Visit  Medication Sig Dispense Refill   acetaminophen (TYLENOL) 500 MG tablet Take 1 tablet (500 mg total) by mouth every 8 (eight) hours as needed for moderate pain. 100 tablet 3   ALPRAZolam (XANAX) 0.5 MG tablet Take 0.5 mg by mouth at bedtime as needed.     aspirin 81 MG EC tablet TAKE 1 TABLET (81 MG TOTAL) BY MOUTH DAILY. 90 tablet 0   Cholecalciferol (VITAMIN D3) 2000 units capsule Take 1 capsule (2,000 Units total) by mouth daily. 100 capsule 3   COLLAGEN PO Take by mouth.     finasteride (PROPECIA) 1 MG tablet Take 1 mg by mouth daily.     Omega-3 Fatty Acids (FISH OIL) 1000 MG CAPS Take by mouth.     sildenafil (VIAGRA) 100 MG tablet TAKE 1 TABLET BY MOUTH EVERY DAY AS NEEDED FOR ERECTILE DYSFUNCTION 12 tablet 11   vitamin B-12 (CYANOCOBALAMIN) 1000 MCG tablet Take 1,000 mcg by mouth daily.     No facility-administered medications prior to visit.    ROS: Review of Systems  Constitutional:  Negative for appetite change, fatigue and unexpected weight change.  HENT:  Negative for congestion, nosebleeds, sneezing, sore throat and trouble swallowing.   Eyes:  Negative for itching and visual disturbance.  Respiratory:  Negative for cough.   Cardiovascular:  Negative for chest pain, palpitations and leg swelling.  Gastrointestinal:  Negative for abdominal distention, blood in stool, diarrhea and nausea.  Genitourinary:  Negative for frequency and hematuria.  Musculoskeletal:  Negative for back pain, gait problem, joint swelling and neck pain.  Skin:  Negative for rash.  Neurological:  Negative for dizziness, tremors, speech difficulty and weakness.  Psychiatric/Behavioral:  Negative for agitation, dysphoric mood, sleep disturbance  and suicidal ideas. The patient is not nervous/anxious.    Objective:  BP 122/68 (BP Location: Left Arm)   Pulse 71   Temp 98.2 F (36.8 C) (Oral)   Ht 6\' 1"  (1.854 m)   Wt 193 lb 9.6 oz (87.8 kg)   SpO2 96%   BMI 25.54 kg/m   BP Readings from Last 3 Encounters:  11/16/20 122/68  12/25/19 120/72  06/24/19 122/72    Wt Readings from Last 3 Encounters:  11/16/20 193 lb 9.6 oz (87.8 kg)  12/25/19 189 lb 6.4 oz (85.9 kg)  06/24/19 187 lb (84.8 kg)    Physical Exam Constitutional:      General: He is not in acute distress.    Appearance: He is well-developed.     Comments: NAD  Eyes:     Conjunctiva/sclera: Conjunctivae normal.     Pupils: Pupils are equal, round, and reactive to light.  Neck:     Thyroid: No thyromegaly.     Vascular: No JVD.  Cardiovascular:     Rate and Rhythm: Normal rate and regular rhythm.     Heart sounds: Normal heart sounds. No murmur heard.   No friction rub. No gallop.  Pulmonary:     Effort: Pulmonary effort is normal. No respiratory distress.     Breath sounds: Normal breath sounds. No wheezing or rales.  Chest:     Chest wall: No tenderness.  Abdominal:     General: Bowel sounds  are normal. There is no distension.     Palpations: Abdomen is soft. There is no mass.     Tenderness: There is no abdominal tenderness. There is no guarding or rebound.  Musculoskeletal:        General: No tenderness. Normal range of motion.     Cervical back: Normal range of motion.  Lymphadenopathy:     Cervical: No cervical adenopathy.  Skin:    General: Skin is warm and dry.     Findings: No rash.  Neurological:     Mental Status: He is alert and oriented to person, place, and time.     Cranial Nerves: No cranial nerve deficit.     Motor: No abnormal muscle tone.     Coordination: Coordination normal.     Gait: Gait normal.     Deep Tendon Reflexes: Reflexes are normal and symmetric.  Psychiatric:        Behavior: Behavior normal.        Thought  Content: Thought content normal.        Judgment: Judgment normal.  Antalgic gait L hip w/pain  Procedure Note :     Procedure : Joint Injection,   left hip   Indication:  Trochanteric bursitis with refractory  chronic pain.   Risks including unsuccessful procedure , bleeding, infection, bruising, skin atrophy, "steroid flare-up" and others were explained to the patient in detail as well as the benefits. Informed consent was obtained verbally.  Tthe patient was placed in a comfortable lateral decubitus position. The point of maximal tenderness was identified. Skin was prepped with Betadine and alcohol. Then, a 5 cc syringe with a 2 inch long 24-gauge needle was used for a bursa injection.. The needle was advanced  Into the bursa. I injected the bursa with 4 mL of 2% lidocaine and 80 mg of Depo-Medrol .  Band-Aid was applied.   Tolerated well. Complications: None. Good pain relief following the procedure.   Postprocedure instructions :    A Band-Aid should be left on for 12 hours. Injection therapy is not a cure itself. It is used in conjunction with other modalities. You can use nonsteroidal anti-inflammatories like ibuprofen , hot and cold compresses. Rest is recommended in the next 24 hours. You need to report immediately  if fever, chills or any signs of infection develop.   Lab Results  Component Value Date   WBC 5.6 04/08/2019   HGB 16.0 04/08/2019   HCT 45.3 04/08/2019   PLT 225.0 04/08/2019   GLUCOSE 76 04/08/2019   CHOL 211 (H) 04/08/2019   TRIG 111.0 04/08/2019   HDL 52.50 04/08/2019   LDLDIRECT 153.5 09/20/2011   LDLCALC 137 (H) 04/08/2019   ALT 17 04/08/2019   AST 13 04/08/2019   NA 142 04/08/2019   K 3.5 04/08/2019   CL 102 04/08/2019   CREATININE 0.95 04/08/2019   BUN 24 (H) 04/08/2019   CO2 32 04/08/2019   TSH 0.94 04/08/2019   PSA 0.15 04/08/2019   HGBA1C 5.3 03/07/2011    CT CARDIAC SCORING  Addendum Date: 07/23/2018   ADDENDUM REPORT: 07/23/2018 12:01  CLINICAL DATA:  Risk stratification EXAM: Coronary Calcium Score TECHNIQUE: The patient was scanned on a Siemens Somatom 64 slice scanner. Axial non-contrast 3 mm slices were carried out through the heart. The data set was analyzed on a dedicated work station and scored using the [place]. FINDINGS: Non-cardiac: See separate report from [facility]. Ascending aorta: Normal diameter 3.5 cm Pericardium: Normal Coronary  arteries: No calcium noted IMPRESSION: Coronary calcium score of 0. Jenkins Rouge Electronically Signed   By: Jenkins Rouge M.D.   On: 07/23/2018 12:01   Result Date: 07/23/2018 EXAM: OVER-READ INTERPRETATION  CT CHEST The following report is an over-read performed by radiologist Dr. Rolm Baptise of [facility], [place] on 07/23/2018. This over-read does not include interpretation of cardiac or coronary anatomy or pathology. The coronary calcium score interpretation by the cardiologist is attached. COMPARISON:  None. FINDINGS: Vascular: Scattered calcifications in the descending thoracic aorta. Heart is normal size. Visualized aorta normal caliber. Mediastinum/Nodes: No adenopathy in the lower mediastinum or hila. Lungs/Pleura: No confluent opacities or effusions. Upper Abdomen: Imaging into the upper abdomen shows no acute findings. Musculoskeletal: Chest wall soft tissues are unremarkable. No acute bony abnormality. IMPRESSION: No acute extra cardiac abnormality. Scattered descending aortic atherosclerosis. Electronically Signed: By: Rolm Baptise M.D. On: 07/23/2018 10:58    Assessment & Plan:   Problem List Items Addressed This Visit     Trochanteric bursitis of left hip    Worse Massage spiky ball McKenzie inflatable pillow See procedure      Well adult exam - Primary    We discussed age appropriate health related issues, including available/recomended screening tests and vaccinations. We discussed a need for adhering to healthy diet and exercise. Labs were ordered to  be later reviewed . All questions were answered. Coronary calcium score of 0 - 2020. Cologuard 2018 (-)      Relevant Orders   TSH   Urinalysis   CBC with Differential/Platelet   Lipid panel   Comprehensive metabolic panel   PSA   Other Visit Diagnoses     Left hip pain       Relevant Medications   methylPREDNISolone acetate (DEPO-MEDROL) injection 80 mg (Completed)   lidocaine-EPINEPHrine (XYLOCAINE W/EPI) 2 %-1:100000 (with pres) injection 3 mL (Completed)         Follow-up: Return in about 1 year (around 11/16/2021) for Wellness Exam.  Walker Kehr, MD

## 2020-11-17 LAB — CBC WITH DIFFERENTIAL/PLATELET
Basophils Absolute: 0.1 10*3/uL (ref 0.0–0.1)
Basophils Relative: 0.7 % (ref 0.0–3.0)
Eosinophils Absolute: 0.1 10*3/uL (ref 0.0–0.7)
Eosinophils Relative: 1 % (ref 0.0–5.0)
HCT: 44.8 % (ref 39.0–52.0)
Hemoglobin: 15.3 g/dL (ref 13.0–17.0)
Lymphocytes Relative: 26.6 % (ref 12.0–46.0)
Lymphs Abs: 1.8 10*3/uL (ref 0.7–4.0)
MCHC: 34.3 g/dL (ref 30.0–36.0)
MCV: 94.3 fl (ref 78.0–100.0)
Monocytes Absolute: 0.7 10*3/uL (ref 0.1–1.0)
Monocytes Relative: 10.8 % (ref 3.0–12.0)
Neutro Abs: 4.1 10*3/uL (ref 1.4–7.7)
Neutrophils Relative %: 60.9 % (ref 43.0–77.0)
Platelets: 225 10*3/uL (ref 150.0–400.0)
RBC: 4.75 Mil/uL (ref 4.22–5.81)
RDW: 12.6 % (ref 11.5–15.5)
WBC: 6.8 10*3/uL (ref 4.0–10.5)

## 2020-11-17 LAB — URINALYSIS
Bilirubin Urine: NEGATIVE
Hgb urine dipstick: NEGATIVE
Ketones, ur: NEGATIVE
Leukocytes,Ua: NEGATIVE
Nitrite: NEGATIVE
Specific Gravity, Urine: 1.025 (ref 1.000–1.030)
Total Protein, Urine: NEGATIVE
Urine Glucose: NEGATIVE
Urobilinogen, UA: 0.2 (ref 0.0–1.0)
pH: 6 (ref 5.0–8.0)

## 2020-11-17 LAB — COMPREHENSIVE METABOLIC PANEL
ALT: 24 U/L (ref 0–53)
AST: 25 U/L (ref 0–37)
Albumin: 4.5 g/dL (ref 3.5–5.2)
Alkaline Phosphatase: 62 U/L (ref 39–117)
BUN: 22 mg/dL (ref 6–23)
CO2: 28 mEq/L (ref 19–32)
Calcium: 10.2 mg/dL (ref 8.4–10.5)
Chloride: 101 mEq/L (ref 96–112)
Creatinine, Ser: 1.23 mg/dL (ref 0.40–1.50)
GFR: 61.68 mL/min (ref >60.00)
Glucose, Bld: 98 mg/dL (ref 70–99)
Potassium: 3.7 mEq/L (ref 3.5–5.1)
Sodium: 141 mEq/L (ref 135–145)
Total Bilirubin: 0.6 mg/dL (ref 0.2–1.2)
Total Protein: 7 g/dL (ref 6.0–8.3)

## 2020-11-17 LAB — LIPID PANEL
Cholesterol: 208 mg/dL — ABNORMAL HIGH (ref 0–200)
HDL: 55.6 mg/dL (ref >39.00)
LDL Cholesterol: 118 mg/dL — ABNORMAL HIGH (ref 0–99)
NonHDL: 152.07
Total CHOL/HDL Ratio: 4
Triglycerides: 172 mg/dL — ABNORMAL HIGH (ref 0.0–149.0)
VLDL: 34.4 mg/dL (ref 0.0–40.0)

## 2020-11-17 LAB — PSA: PSA: 0.33 ng/mL (ref 0.10–4.00)

## 2020-11-17 LAB — TSH: TSH: 1.18 u[IU]/mL (ref 0.35–5.50)

## 2020-12-13 IMAGING — DX DG HIP (WITH OR WITHOUT PELVIS) 2-3V*R*
2 series · 2 of 2 positions shown · non-contrast
Comparison: None.

CLINICAL DATA: Right hip pain. Chronic pain and stiffness.

EXAM:
DG HIP (WITH OR WITHOUT PELVIS) 2-3V RIGHT

[hip ap]
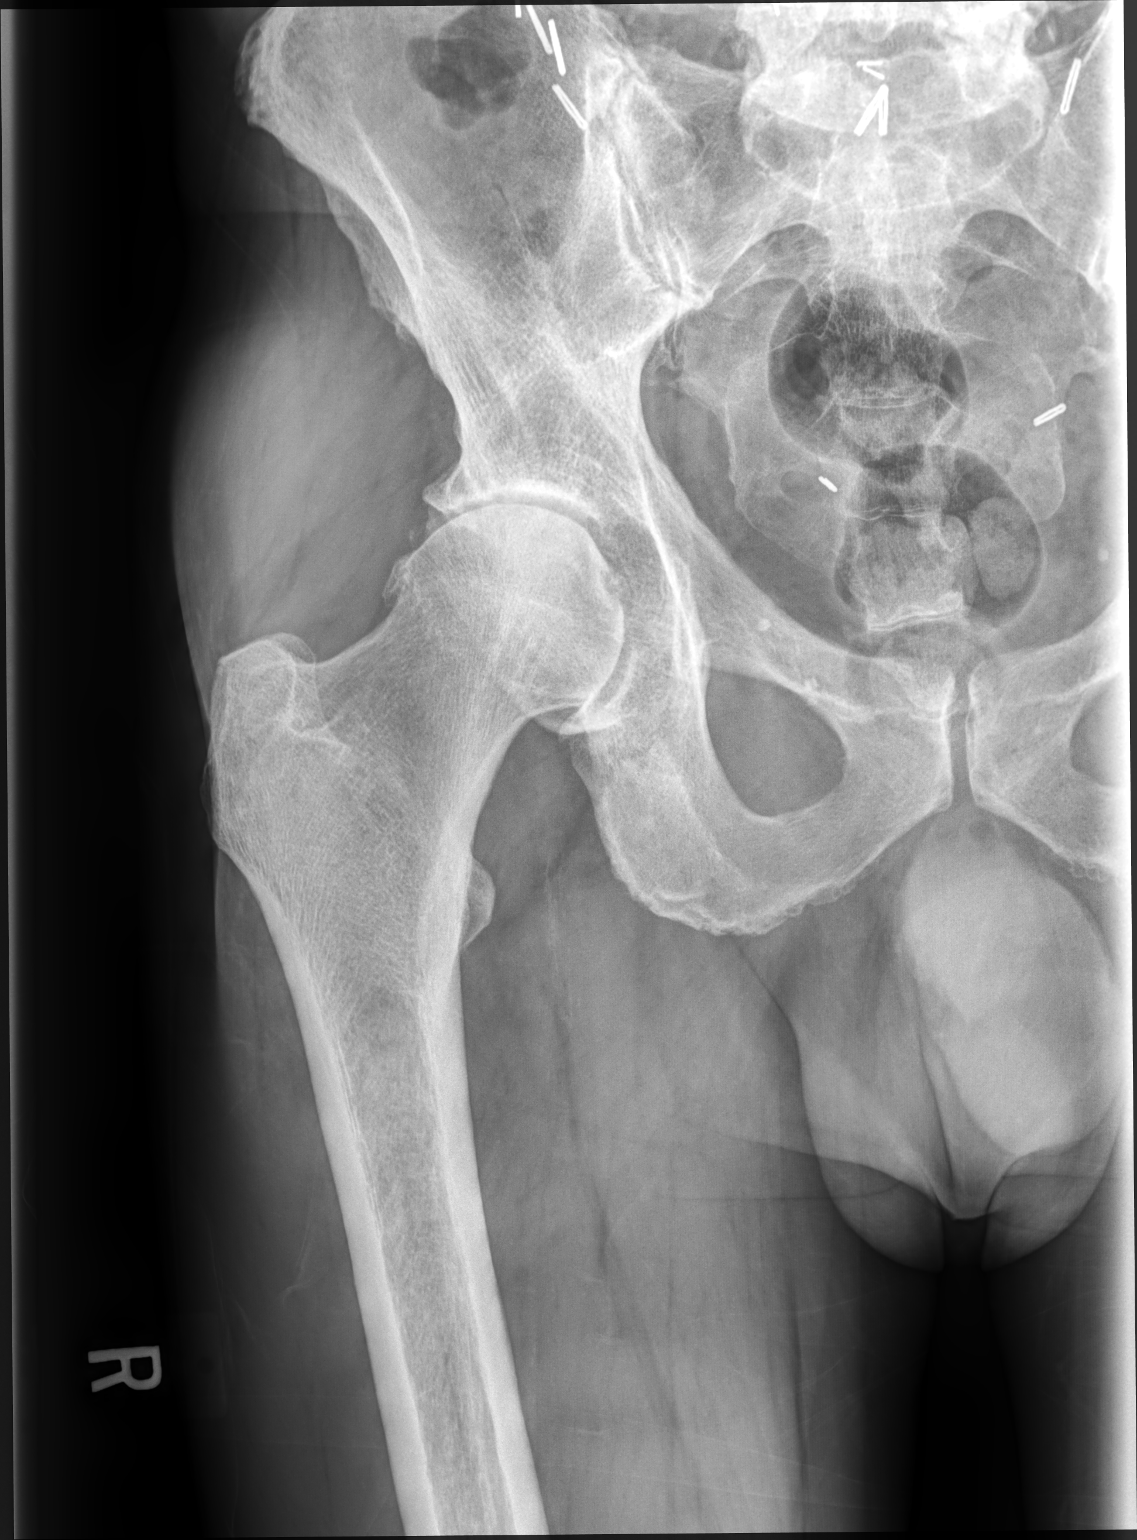

[hip (frog leg)]
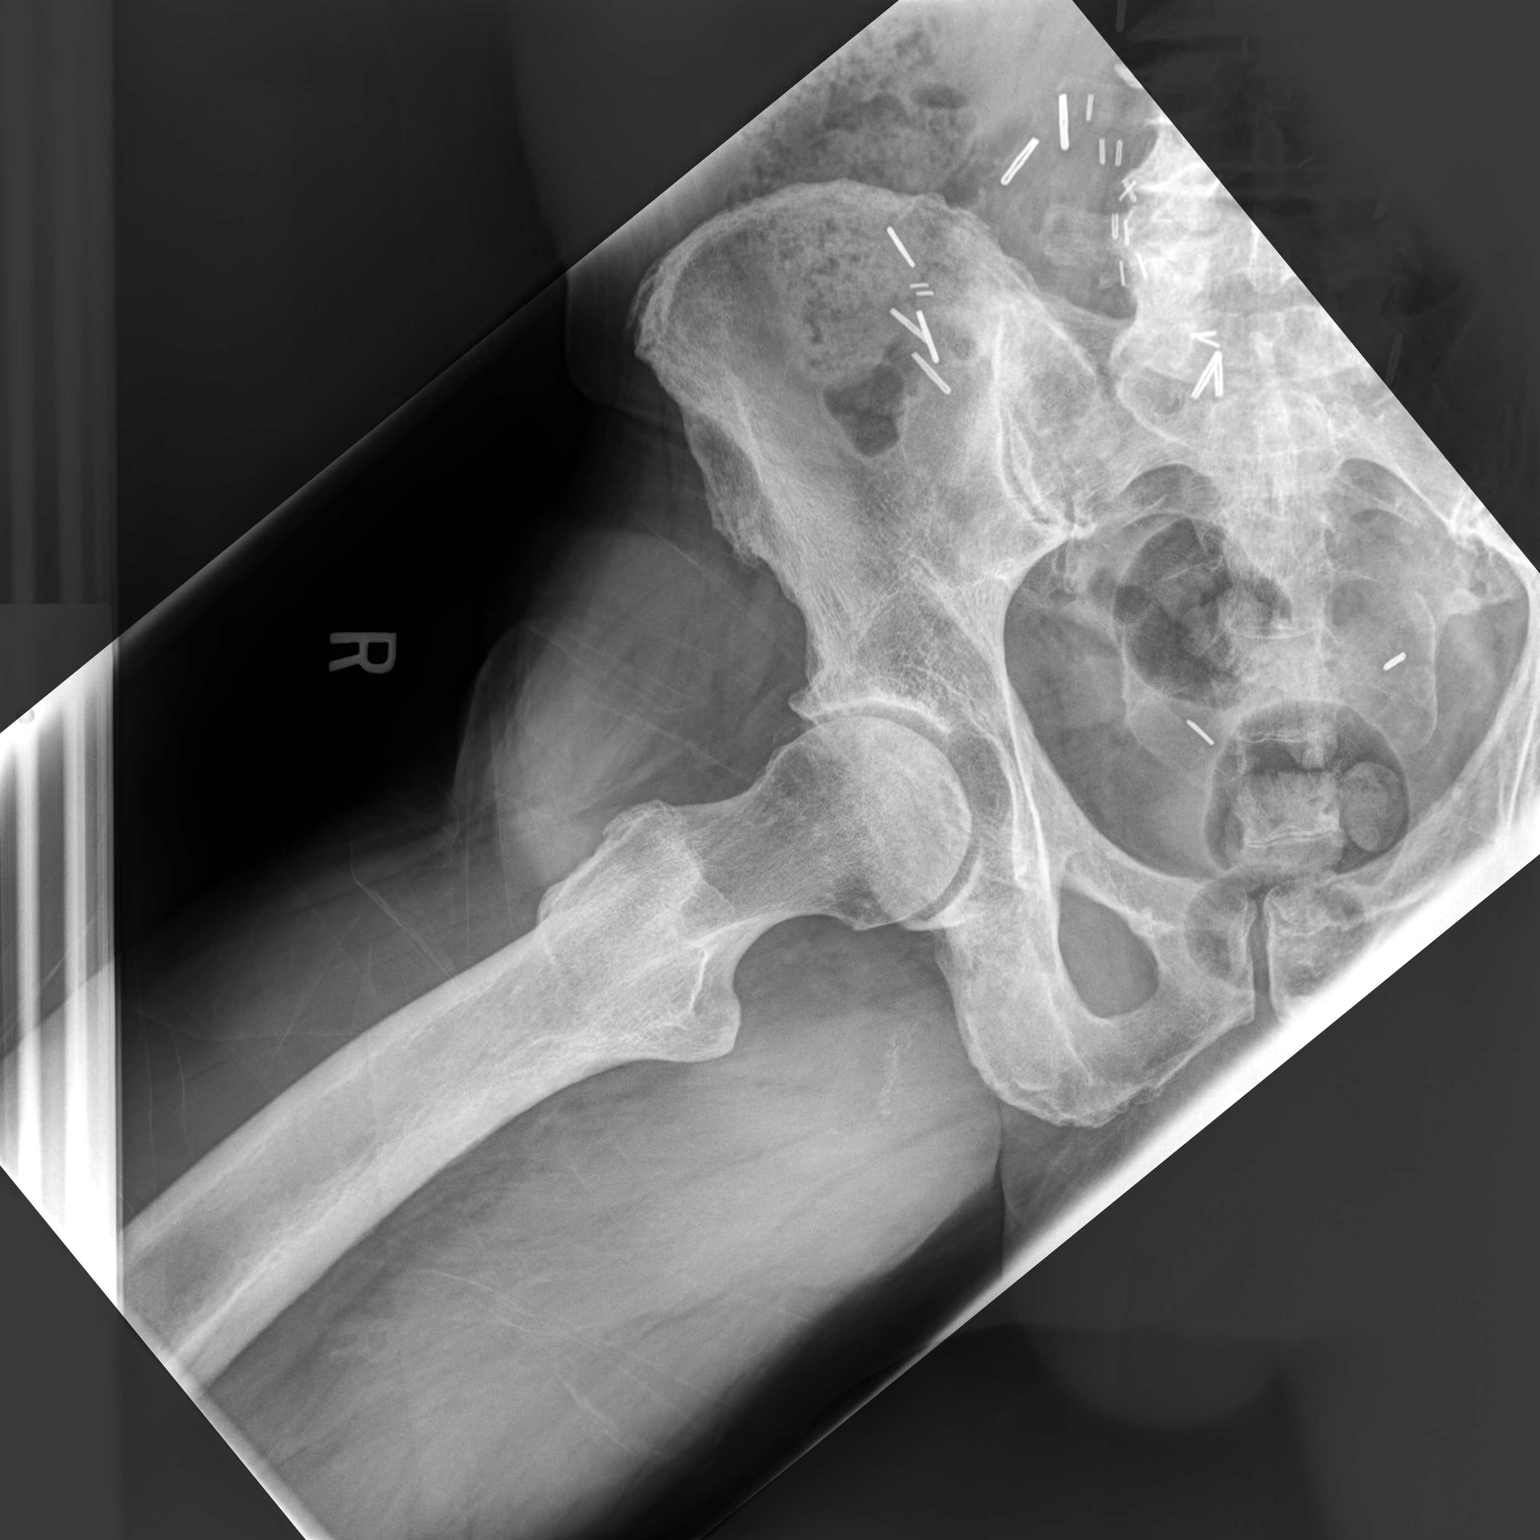

[2 of 2 positions shown; findings below may reference images not displayed]

FINDINGS: Moderate degenerative changes are present within the right hip.
There is loss of joint space. Osteophytes are present. No acute or
healing fractures are present. The visualized hemipelvis is clear.
Surgical clips are noted.
IMPRESSION: Moderate degenerative changes of the right hip.

## 2021-01-11 DIAGNOSIS — H00024 Hordeolum internum left upper eyelid: Secondary | ICD-10-CM | POA: Diagnosis not present

## 2021-11-21 ENCOUNTER — Encounter: Payer: Self-pay | Admitting: Internal Medicine

## 2021-11-21 ENCOUNTER — Other Ambulatory Visit (INDEPENDENT_AMBULATORY_CARE_PROVIDER_SITE_OTHER): Payer: Medicare Other

## 2021-11-21 ENCOUNTER — Ambulatory Visit (INDEPENDENT_AMBULATORY_CARE_PROVIDER_SITE_OTHER): Payer: Medicare Other | Admitting: Internal Medicine

## 2021-11-21 VITALS — BP 120/82 | HR 72 | Temp 98.2°F | Ht 73.0 in | Wt 195.0 lb

## 2021-11-21 DIAGNOSIS — M1990 Unspecified osteoarthritis, unspecified site: Secondary | ICD-10-CM

## 2021-11-21 DIAGNOSIS — Z125 Encounter for screening for malignant neoplasm of prostate: Secondary | ICD-10-CM

## 2021-11-21 DIAGNOSIS — Z0000 Encounter for general adult medical examination without abnormal findings: Secondary | ICD-10-CM

## 2021-11-21 DIAGNOSIS — M7062 Trochanteric bursitis, left hip: Secondary | ICD-10-CM

## 2021-11-21 DIAGNOSIS — Z1211 Encounter for screening for malignant neoplasm of colon: Secondary | ICD-10-CM

## 2021-11-21 DIAGNOSIS — E785 Hyperlipidemia, unspecified: Secondary | ICD-10-CM | POA: Diagnosis not present

## 2021-11-21 DIAGNOSIS — M7061 Trochanteric bursitis, right hip: Secondary | ICD-10-CM

## 2021-11-21 NOTE — Assessment & Plan Note (Signed)
Coronary calcium CT score of 0 - 2020.

## 2021-11-21 NOTE — Assessment & Plan Note (Addendum)
We discussed age appropriate health related issues, including available/recomended screening tests and vaccinations. We discussed a need for adhering to healthy diet and exercise. Labs were ordered to be later reviewed . All questions were answered. Coronary calcium CT score of 0 - 2020. Cologuard 2018 (-)

## 2021-11-21 NOTE — Assessment & Plan Note (Signed)
Massage with a spiky ball McKenzie inflatable pillow Will inject with steroids.  See procedure Use ice Blue-Emu cream was recommended to use 2-3 times a day Ortho ref

## 2021-11-21 NOTE — Patient Instructions (Signed)
Blue-Emu cream -- use 2-3 times a day ? ?

## 2021-11-21 NOTE — Progress Notes (Addendum)
Subjective:  Patient ID: Calvin Burke, male    DOB: 29-Nov-1955  Age: 66 y.o. MRN: [id number]  CC: Annual Exam   HPI Calvin Burke presents for a well exam  C/o R leg pain, B hip pain x months  Outpatient Medications Prior to Visit  Medication Sig Dispense Refill   acetaminophen (TYLENOL) 500 MG tablet Take 1 tablet (500 mg total) by mouth every 8 (eight) hours as needed for moderate pain. 100 tablet 3   aspirin 81 MG EC tablet TAKE 1 TABLET (81 MG TOTAL) BY MOUTH DAILY. 90 tablet 0   Cholecalciferol (VITAMIN D3) 2000 units capsule Take 1 capsule (2,000 Units total) by mouth daily. 100 capsule 3   COLLAGEN PO Take by mouth.     finasteride (PROPECIA) 1 MG tablet Take 1 mg by mouth daily.     Omega-3 Fatty Acids (FISH OIL) 1000 MG CAPS Take by mouth.     sildenafil (VIAGRA) 100 MG tablet TAKE 1 TABLET BY MOUTH EVERY DAY AS NEEDED FOR ERECTILE DYSFUNCTION 12 tablet 11   vitamin B-12 (CYANOCOBALAMIN) 1000 MCG tablet Take 1,000 mcg by mouth daily.     ALPRAZolam (XANAX) 0.5 MG tablet Take 0.5 mg by mouth at bedtime as needed. (Patient not taking: Reported on 11/21/2021)     No facility-administered medications prior to visit.    ROS: Review of Systems  Constitutional:  Negative for appetite change, fatigue and unexpected weight change.  HENT:  Negative for congestion, nosebleeds, sneezing, sore throat and trouble swallowing.   Eyes:  Negative for itching and visual disturbance.  Respiratory:  Negative for cough.   Cardiovascular:  Negative for chest pain, palpitations and leg swelling.  Gastrointestinal:  Negative for abdominal distention, blood in stool, diarrhea and nausea.  Genitourinary:  Negative for frequency and hematuria.  Musculoskeletal:  Positive for arthralgias, back pain and gait problem. Negative for joint swelling, myalgias and neck pain.  Skin:  Negative for rash.  Neurological:  Negative for dizziness, tremors, speech difficulty and weakness.   Psychiatric/Behavioral:  Negative for agitation, dysphoric mood, sleep disturbance and suicidal ideas. The patient is not nervous/anxious.     Objective:  BP 120/82 (BP Location: Left Arm, Patient Position: Sitting, Cuff Size: Large)   Pulse 72   Temp 98.2 F (36.8 C) (Oral)   Ht '6\' 1"'$  (1.854 m)   Wt 195 lb (88.5 kg)   SpO2 98%   BMI 25.73 kg/m   BP Readings from Last 3 Encounters:  11/21/21 120/82  11/16/20 122/68  12/25/19 120/72    Wt Readings from Last 3 Encounters:  11/21/21 195 lb (88.5 kg)  11/16/20 193 lb 9.6 oz (87.8 kg)  12/25/19 189 lb 6.4 oz (85.9 kg)    Physical Exam Constitutional:      General: He is not in acute distress.    Appearance: Normal appearance. He is well-developed.     Comments: NAD  Eyes:     Conjunctiva/sclera: Conjunctivae normal.     Pupils: Pupils are equal, round, and reactive to light.  Neck:     Thyroid: No thyromegaly.     Vascular: No JVD.  Cardiovascular:     Rate and Rhythm: Normal rate and regular rhythm.     Heart sounds: Normal heart sounds. No murmur heard.    No friction rub. No gallop.  Pulmonary:     Effort: Pulmonary effort is normal. No respiratory distress.     Breath sounds: Normal breath sounds. No wheezing or rales.  Chest:     Chest wall: No tenderness.  Abdominal:     General: Bowel sounds are normal. There is no distension.     Palpations: Abdomen is soft. There is no mass.     Tenderness: There is no abdominal tenderness. There is no guarding or rebound.  Musculoskeletal:        General: No tenderness. Normal range of motion.     Cervical back: Normal range of motion.  Lymphadenopathy:     Cervical: No cervical adenopathy.  Skin:    General: Skin is warm and dry.     Findings: No rash.  Neurological:     Mental Status: He is alert and oriented to person, place, and time.     Cranial Nerves: No cranial nerve deficit.     Motor: No abnormal muscle tone.     Coordination: Coordination normal.      Gait: Gait normal.     Deep Tendon Reflexes: Reflexes are normal and symmetric.  Psychiatric:        Behavior: Behavior normal.        Thought Content: Thought content normal.        Judgment: Judgment normal.   Rectal declined B lat hips w/pain Str leg elev (-) B Hips B and LS spine  - stiff  Lab Results  Component Value Date   WBC 6.8 11/16/2020   HGB 15.3 11/16/2020   HCT 44.8 11/16/2020   PLT 225.0 11/16/2020   GLUCOSE 98 11/16/2020   CHOL 208 (H) 11/16/2020   TRIG 172.0 (H) 11/16/2020   HDL 55.60 11/16/2020   LDLDIRECT 153.5 09/20/2011   LDLCALC 118 (H) 11/16/2020   ALT 24 11/16/2020   AST 25 11/16/2020   NA 141 11/16/2020   K 3.7 11/16/2020   CL 101 11/16/2020   CREATININE 1.23 11/16/2020   BUN 22 11/16/2020   CO2 28 11/16/2020   TSH 1.18 11/16/2020   PSA 0.33 11/16/2020   HGBA1C 5.3 03/07/2011    CT CARDIAC SCORING  Addendum Date: 07/23/2018   ADDENDUM REPORT: 07/23/2018 12:01 CLINICAL DATA:  Risk stratification EXAM: Coronary Calcium Score TECHNIQUE: The patient was scanned on a Siemens Somatom 64 slice scanner. Axial non-contrast 3 mm slices were carried out through the heart. The data set was analyzed on a dedicated work station and scored using the [place]. FINDINGS: Non-cardiac: See separate report from [facility]. Ascending aorta: Normal diameter 3.5 cm Pericardium: Normal Coronary arteries: No calcium noted IMPRESSION: Coronary calcium score of 0. Jenkins Rouge Electronically Signed   By: Jenkins Rouge M.D.   On: 07/23/2018 12:01   Result Date: 07/23/2018 EXAM: OVER-READ INTERPRETATION  CT CHEST The following report is an over-read performed by radiologist Dr. Rolm Baptise of [facility], [place] on 07/23/2018. This over-read does not include interpretation of cardiac or coronary anatomy or pathology. The coronary calcium score interpretation by the cardiologist is attached. COMPARISON:  None. FINDINGS: Vascular: Scattered calcifications in the  descending thoracic aorta. Heart is normal size. Visualized aorta normal caliber. Mediastinum/Nodes: No adenopathy in the lower mediastinum or hila. Lungs/Pleura: No confluent opacities or effusions. Upper Abdomen: Imaging into the upper abdomen shows no acute findings. Musculoskeletal: Chest wall soft tissues are unremarkable. No acute bony abnormality. IMPRESSION: No acute extra cardiac abnormality. Scattered descending aortic atherosclerosis. Electronically Signed: By: Rolm Baptise M.D. On: 07/23/2018 10:58    Assessment & Plan:   Problem List Items Addressed This Visit     Hip bursitis  Massage with a spiky ball McKenzie inflatable pillow Will inject with steroids.  See procedure Use ice Blue-Emu cream was recommended to use 2-3 times a day Ortho ref      Hyperlipidemia with target LDL less than 130    Coronary calcium CT score of 0 - 2020.      Osteoarthritis    Worse       Relevant Orders   Ambulatory referral to Orthopedic Surgery   Well adult exam - Primary    We discussed age appropriate health related issues, including available/recomended screening tests and vaccinations. We discussed a need for adhering to healthy diet and exercise. Labs were ordered to be later reviewed . All questions were answered. Coronary calcium CT score of 0 - 2020. Cologuard 2018 (-)      Relevant Orders   TSH   Urinalysis   CBC with Differential/Platelet   Lipid panel   PSA   Comprehensive metabolic panel   Other Visit Diagnoses     Colon cancer screening       Relevant Orders   Cologuard         No orders of the defined types were placed in this encounter.     Follow-up: Return in about 3 months (around 02/21/2022) for a follow-up visit.  Walker Kehr, MD

## 2021-11-21 NOTE — Assessment & Plan Note (Signed)
Worse 

## 2021-11-22 LAB — LIPID PANEL
Cholesterol: 213 mg/dL — ABNORMAL HIGH (ref 0–200)
HDL: 55.8 mg/dL (ref >39.00)
NonHDL: 157.36
Total CHOL/HDL Ratio: 4
Triglycerides: 308 mg/dL — ABNORMAL HIGH (ref 0.0–149.0)
VLDL: 61.6 mg/dL — ABNORMAL HIGH (ref 0.0–40.0)

## 2021-11-22 LAB — CBC WITH DIFFERENTIAL/PLATELET
Basophils Absolute: 0 10*3/uL (ref 0.0–0.1)
Basophils Relative: 0.7 % (ref 0.0–3.0)
Eosinophils Absolute: 0.1 10*3/uL (ref 0.0–0.7)
Eosinophils Relative: 1.8 % (ref 0.0–5.0)
HCT: 41.2 % (ref 39.0–52.0)
Hemoglobin: 14.8 g/dL (ref 13.0–17.0)
Lymphocytes Relative: 22.5 % (ref 12.0–46.0)
Lymphs Abs: 1.4 10*3/uL (ref 0.7–4.0)
MCHC: 35.9 g/dL (ref 30.0–36.0)
MCV: 93.5 fl (ref 78.0–100.0)
Monocytes Absolute: 0.6 10*3/uL (ref 0.1–1.0)
Monocytes Relative: 10.3 % (ref 3.0–12.0)
Neutro Abs: 4 10*3/uL (ref 1.4–7.7)
Neutrophils Relative %: 64.7 % (ref 43.0–77.0)
Platelets: 226 10*3/uL (ref 150.0–400.0)
RBC: 4.41 Mil/uL (ref 4.22–5.81)
RDW: 12.6 % (ref 11.5–15.5)
WBC: 6.2 10*3/uL (ref 4.0–10.5)

## 2021-11-22 LAB — COMPREHENSIVE METABOLIC PANEL
ALT: 21 U/L (ref 0–53)
AST: 19 U/L (ref 0–37)
Albumin: 4.3 g/dL (ref 3.5–5.2)
Alkaline Phosphatase: 54 U/L (ref 39–117)
BUN: 20 mg/dL (ref 6–23)
CO2: 24 mEq/L (ref 19–32)
Calcium: 9.7 mg/dL (ref 8.4–10.5)
Chloride: 104 mEq/L (ref 96–112)
Creatinine, Ser: 0.95 mg/dL (ref 0.40–1.50)
GFR: 83.5 mL/min (ref >60.00)
Glucose, Bld: 98 mg/dL (ref 70–99)
Potassium: 3.8 mEq/L (ref 3.5–5.1)
Sodium: 139 mEq/L (ref 135–145)
Total Bilirubin: 0.4 mg/dL (ref 0.2–1.2)
Total Protein: 6.6 g/dL (ref 6.0–8.3)

## 2021-11-22 LAB — URINALYSIS
Bilirubin Urine: NEGATIVE
Hgb urine dipstick: NEGATIVE
Ketones, ur: NEGATIVE
Leukocytes,Ua: NEGATIVE
Nitrite: NEGATIVE
Specific Gravity, Urine: 1.02 (ref 1.000–1.030)
Total Protein, Urine: NEGATIVE
Urine Glucose: NEGATIVE
Urobilinogen, UA: 0.2 (ref 0.0–1.0)
pH: 5.5 (ref 5.0–8.0)

## 2021-11-22 LAB — LDL CHOLESTEROL, DIRECT: Direct LDL: 136 mg/dL

## 2021-11-22 LAB — TSH: TSH: 1.18 u[IU]/mL (ref 0.35–5.50)

## 2021-11-22 LAB — PSA: PSA: 0.24 ng/mL (ref 0.10–4.00)

## 2021-12-26 ENCOUNTER — Telehealth: Payer: Self-pay | Admitting: Internal Medicine

## 2021-12-26 NOTE — Telephone Encounter (Signed)
LVM for pt to rtn my call to schedule AWV with [facility] call back # [phone] 

## 2021-12-27 ENCOUNTER — Encounter: Payer: Self-pay | Admitting: Internal Medicine

## 2021-12-27 ENCOUNTER — Telehealth: Payer: Self-pay | Admitting: Internal Medicine

## 2021-12-27 ENCOUNTER — Ambulatory Visit (INDEPENDENT_AMBULATORY_CARE_PROVIDER_SITE_OTHER): Payer: Medicare Other | Admitting: Internal Medicine

## 2021-12-27 ENCOUNTER — Ambulatory Visit (INDEPENDENT_AMBULATORY_CARE_PROVIDER_SITE_OTHER): Payer: Medicare Other

## 2021-12-27 VITALS — BP 124/70 | HR 71 | Temp 98.3°F | Ht 73.0 in | Wt 191.0 lb

## 2021-12-27 DIAGNOSIS — R062 Wheezing: Secondary | ICD-10-CM

## 2021-12-27 DIAGNOSIS — L989 Disorder of the skin and subcutaneous tissue, unspecified: Secondary | ICD-10-CM

## 2021-12-27 DIAGNOSIS — R059 Cough, unspecified: Secondary | ICD-10-CM | POA: Diagnosis not present

## 2021-12-27 DIAGNOSIS — J209 Acute bronchitis, unspecified: Secondary | ICD-10-CM

## 2021-12-27 MED ORDER — AMOXICILLIN-POT CLAVULANATE 875-125 MG PO TABS: 1.0000 | ORAL_TABLET | Freq: Two times a day (BID) | ORAL | 0 refills | Status: DC

## 2021-12-27 MED ORDER — METHYLPREDNISOLONE ACETATE 80 MG/ML IJ SUSP
80.0000 mg | Freq: Once | INTRAMUSCULAR | Status: AC
  Administered 2021-12-27: 80 mg via INTRAMUSCULAR

## 2021-12-27 NOTE — Patient Instructions (Addendum)
    Have an xray downstairs.     You received a steroid injection today for your wheezing.     Medications changes include :   Augmentin twice daily for 10 days.    Return if symptoms worsen or fail to improve.

## 2021-12-27 NOTE — Progress Notes (Signed)
Subjective:    Patient ID: Calvin SAUSEDO, male    DOB: 1955-05-28, 66 y.o.   MRN: [id number]      HPI Calvin Burke is here for  Chief Complaint  Patient presents with   Cough    Cough with nasal drainage; Cough x 3 weeks     His symptoms started 3 weeks ago.  He is experiencing a cough - his granddaughter got it first and it spread through the family.  He has some PND, mild nasal congestion, cough that is primarily dry - occ brings up sputum, wheezing, diarrhea.  The past 2-3 days he is coughing more sputum up.    He has tried taking delsym, alka seltzer cold and cough     Medications and allergies reviewed with patient and updated if appropriate.  Current Outpatient Medications on File Prior to Visit  Medication Sig Dispense Refill   acetaminophen (TYLENOL) 500 MG tablet Take 1 tablet (500 mg total) by mouth every 8 (eight) hours as needed for moderate pain. 100 tablet 3   aspirin 81 MG EC tablet TAKE 1 TABLET (81 MG TOTAL) BY MOUTH DAILY. 90 tablet 0   Cholecalciferol (VITAMIN D3) 2000 units capsule Take 1 capsule (2,000 Units total) by mouth daily. 100 capsule 3   COLLAGEN PO Take by mouth.     finasteride (PROPECIA) 1 MG tablet Take 1 mg by mouth daily.     Omega-3 Fatty Acids (FISH OIL) 1000 MG CAPS Take by mouth.     sildenafil (VIAGRA) 100 MG tablet TAKE 1 TABLET BY MOUTH EVERY DAY AS NEEDED FOR ERECTILE DYSFUNCTION 12 tablet 11   vitamin B-12 (CYANOCOBALAMIN) 1000 MCG tablet Take 1,000 mcg by mouth daily.     No current facility-administered medications on file prior to visit.    Review of Systems  Constitutional:  Positive for fatigue (mild). Negative for appetite change, chills and fever.  HENT:  Positive for congestion (mild) and postnasal drip. Negative for ear pain, sinus pressure, sinus pain and sore throat.   Respiratory:  Positive for cough (dry, occ brings up sputum) and wheezing (occ). Negative for chest tightness and shortness of breath.    Cardiovascular:  Negative for chest pain.  Gastrointestinal:  Positive for diarrhea. Negative for nausea.  Musculoskeletal:  Negative for myalgias.  Neurological:  Negative for dizziness, light-headedness and headaches.       Objective:   Vitals:   12/27/21 0909  BP: 124/70  Pulse: 71  Temp: 98.3 F (36.8 C)  SpO2: 97%   BP Readings from Last 3 Encounters:  12/27/21 124/70  11/21/21 120/82  11/16/20 122/68   Wt Readings from Last 3 Encounters:  12/27/21 191 lb (86.6 kg)  11/21/21 195 lb (88.5 kg)  11/16/20 193 lb 9.6 oz (87.8 kg)   Body mass index is 25.2 kg/m.    Physical Exam Constitutional:      General: He is not in acute distress.    Appearance: Normal appearance. He is not ill-appearing.  HENT:     Head: Normocephalic.     Right Ear: Tympanic membrane, ear canal and external ear normal. There is no impacted cerumen.     Left Ear: Tympanic membrane, ear canal and external ear normal. There is no impacted cerumen.     Mouth/Throat:     Mouth: Mucous membranes are moist.     Pharynx: No oropharyngeal exudate or posterior oropharyngeal erythema.  Eyes:     Conjunctiva/sclera: Conjunctivae normal.  Cardiovascular:  Rate and Rhythm: Normal rate and regular rhythm.  Pulmonary:     Effort: Pulmonary effort is normal. No respiratory distress.     Breath sounds: Wheezing (diffuse expiratory wheeze) present. No rales.  Musculoskeletal:     Cervical back: Neck supple. No tenderness.  Lymphadenopathy:     Cervical: No cervical adenopathy.  Skin:    General: Skin is warm and dry.     Findings: No rash.  Neurological:     Mental Status: He is alert.        DG Chest 2 View CLINICAL DATA:  Cough, wheezing  EXAM: CHEST - 2 VIEW  COMPARISON:  05/08/2017  FINDINGS: Cardiac size is within normal limits. There are no signs of alveolar pulmonary edema. There is peribronchial thickening. Increased interstitial markings are seen in the right apex  suggesting scarring. There is no pleural effusion or pneumothorax. Surgical clips are seen in upper abdomen.  IMPRESSION: There are no signs of pulmonary edema or focal pulmonary consolidation. Peribronchial thickening suggests bronchitis.  Electronically Signed   By: Elmer Picker M.D.   On: 12/27/2021 09:58      Assessment & Plan:    Acute bronchitis, wheezing: Acute Him started 2-3 weeks ago and cough has gotten worse the past few days He does have a productive cough, diffuse wheezing on exam Chest x-ray today to rule out pneumonia-no evidence of pneumonia, but he does have bronchitis Given length of symptoms and exam findings this is likely bacterial Start Augmentin 875-125 mg twice daily x10 days Depo-Medrol 80 mg IM x1 given for diffuse wheezing Over-the-counter cold medications for symptom relief Call if no improvement or if symptoms worsen

## 2021-12-27 NOTE — Telephone Encounter (Signed)
Patient called because he had an appointment with Dr Quay Burow today and he said she saw a spot on his head and recommended seeing a dermatologist and he asked if a referral could be put in. Call back is [phone]

## 2021-12-27 NOTE — Addendum Note (Signed)
Addended by: Marcina Millard on: 12/27/2021 01:04 PM   Modules accepted: Orders

## 2021-12-30 ENCOUNTER — Ambulatory Visit (INDEPENDENT_AMBULATORY_CARE_PROVIDER_SITE_OTHER): Payer: Medicare Other | Admitting: *Deleted

## 2021-12-30 DIAGNOSIS — Z0000 Encounter for general adult medical examination without abnormal findings: Secondary | ICD-10-CM | POA: Diagnosis not present

## 2021-12-30 NOTE — Patient Instructions (Signed)
Health Maintenance, Male Adopting a healthy lifestyle and getting preventive care are important in promoting health and wellness. Ask your health care provider about: The right schedule for you to have regular tests and exams. Things you can do on your own to prevent diseases and keep yourself healthy. What should I know about diet, weight, and exercise? Eat a healthy diet  Eat a diet that includes plenty of vegetables, fruits, low-fat dairy products, and lean protein. Do not eat a lot of foods that are high in solid fats, added sugars, or sodium. Maintain a healthy weight Body mass index (BMI) is a measurement that can be used to identify possible weight problems. It estimates body fat based on height and weight. Your health care provider can help determine your BMI and help you achieve or maintain a healthy weight. Get regular exercise Get regular exercise. This is one of the most important things you can do for your health. Most adults should: Exercise for at least 150 minutes each week. The exercise should increase your heart rate and make you sweat (moderate-intensity exercise). Do strengthening exercises at least twice a week. This is in addition to the moderate-intensity exercise. Spend less time sitting. Even light physical activity can be beneficial. Watch cholesterol and blood lipids Have your blood tested for lipids and cholesterol at 66 years of age, then have this test every 5 years. You may need to have your cholesterol levels checked more often if: Your lipid or cholesterol levels are high. You are older than 66 years of age. You are at high risk for heart disease. What should I know about cancer screening? Many types of cancers can be detected early and may often be prevented. Depending on your health history and family history, you may need to have cancer screening at various ages. This may include screening for: Colorectal cancer. Prostate cancer. Skin cancer. Lung  cancer. What should I know about heart disease, diabetes, and high blood pressure? Blood pressure and heart disease High blood pressure causes heart disease and increases the risk of stroke. This is more likely to develop in people who have high blood pressure readings or are overweight. Talk with your health care provider about your target blood pressure readings. Have your blood pressure checked: Every 3-5 years if you are 18-39 years of age. Every year if you are 40 years old or older. If you are between the ages of 65 and 75 and are a current or former smoker, ask your health care provider if you should have a one-time screening for abdominal aortic aneurysm (AAA). Diabetes Have regular diabetes screenings. This checks your fasting blood sugar level. Have the screening done: Once every three years after age 45 if you are at a normal weight and have a low risk for diabetes. More often and at a younger age if you are overweight or have a high risk for diabetes. What should I know about preventing infection? Hepatitis B If you have a higher risk for hepatitis B, you should be screened for this virus. Talk with your health care provider to find out if you are at risk for hepatitis B infection. Hepatitis C Blood testing is recommended for: Everyone born from 1945 through 1965. Anyone with known risk factors for hepatitis C. Sexually transmitted infections (STIs) You should be screened each year for STIs, including gonorrhea and chlamydia, if: You are sexually active and are younger than 66 years of age. You are older than 66 years of age and your   health care provider tells you that you are at risk for this type of infection. Your sexual activity has changed since you were last screened, and you are at increased risk for chlamydia or gonorrhea. Ask your health care provider if you are at risk. Ask your health care provider about whether you are at high risk for HIV. Your health care provider  may recommend a prescription medicine to help prevent HIV infection. If you choose to take medicine to prevent HIV, you should first get tested for HIV. You should then be tested every 3 months for as long as you are taking the medicine. Follow these instructions at home: Alcohol use Do not drink alcohol if your health care provider tells you not to drink. If you drink alcohol: Limit how much you have to 0-2 drinks a day. Know how much alcohol is in your drink. In the [place] one drink equals one 12 oz bottle of beer (355 mL), one 5 oz glass of Elisheba Mcdonnell (148 mL), or one 1 oz glass of hard liquor (44 mL). Lifestyle Do not use any products that contain nicotine or tobacco. These products include cigarettes, chewing tobacco, and vaping devices, such as e-cigarettes. If you need help quitting, ask your health care provider. Do not use street drugs. Do not share needles. Ask your health care provider for help if you need support or information about quitting drugs. General instructions Schedule regular health, dental, and eye exams. Stay current with your vaccines. Tell your health care provider if: You often feel depressed. You have ever been abused or do not feel safe at home. Summary Adopting a healthy lifestyle and getting preventive care are important in promoting health and wellness. Follow your health care provider's instructions about healthy diet, exercising, and getting tested or screened for diseases. Follow your health care provider's instructions on monitoring your cholesterol and blood pressure. This information is not intended to replace advice given to you by your health care provider. Make sure you discuss any questions you have with your health care provider. Document Revised: 06/21/2020 Document Reviewed: 06/21/2020 Elsevier Patient Education  2023 Elsevier Inc.  

## 2021-12-30 NOTE — Progress Notes (Cosign Needed Addendum)
Subjective:   ANDRAY ASSEFA is a 66 y.o. male who presents for an Initial Medicare Annual Wellness Visit. I connected with  Sherron Ales on 12/30/21 by a audio enabled telemedicine application and verified that I am speaking with the correct person using two identifiers.  Patient Location: Home  Provider Location: Home Office  I discussed the limitations of evaluation and management by telemedicine. The patient expressed understanding and agreed to proceed.  Review of Systems    Deferred to PCP Cardiac Risk Factors include: advanced age (>88mn, >>12women);male gender     Objective:    There were no vitals filed for this visit. There is no height or weight on file to calculate BMI.     12/30/2021    3:38 PM 03/06/2011    7:02 PM  Advanced Directives  Does Patient Have a Medical Advance Directive? No Patient does not have advance directive;Patient would not like information  Would patient like information on creating a medical advance directive? No - Patient declined     Current Medications (verified) Outpatient Encounter Medications as of 12/30/2021  Medication Sig   acetaminophen (TYLENOL) 500 MG tablet Take 1 tablet (500 mg total) by mouth every 8 (eight) hours as needed for moderate pain.   amoxicillin-clavulanate (AUGMENTIN) 875-125 MG tablet Take 1 tablet by mouth 2 (two) times daily.   aspirin 81 MG EC tablet TAKE 1 TABLET (81 MG TOTAL) BY MOUTH DAILY.   Cholecalciferol (VITAMIN D3) 2000 units capsule Take 1 capsule (2,000 Units total) by mouth daily.   COLLAGEN PO Take by mouth.   finasteride (PROPECIA) 1 MG tablet Take 1 mg by mouth daily.   Omega-3 Fatty Acids (FISH OIL) 1000 MG CAPS Take by mouth.   sildenafil (VIAGRA) 100 MG tablet TAKE 1 TABLET BY MOUTH EVERY DAY AS NEEDED FOR ERECTILE DYSFUNCTION   vitamin B-12 (CYANOCOBALAMIN) 1000 MCG tablet Take 1,000 mcg by mouth daily.   No facility-administered encounter medications on file as of 12/30/2021.     Allergies (verified) Bleomycin, Lipitor [atorvastatin calcium], and Simvastatin   History: Past Medical History:  Diagnosis Date   [place]   test[place]   [place] bowel obstruction ([facility]    x 1998 & 2012   Past Surgical History:  Procedure Laterality Date   NEPHRECTOMY  1982   for cancer - left kidney removed   SURGERY SCROTAL / TESTICULAR  1982   for cancer   Family History  Problem Relation Age of Onset   Alcohol abuse Mother    Throat cancer Mother    Cancer Father    Heart disease Father        a fib   Diabetes Neg Hx    Early death Neg Hx    Hyperlipidemia Neg Hx    Hypertension Neg Hx    Kidney disease Neg Hx    Stroke Neg Hx    Social History   Socioeconomic History   Marital status: Married    Spouse name: TCoralyn Mark  Number of children: Not on file   Years of education: Not on file   Highest education level: Not on file  Occupational History   Not on file  Tobacco Use   Smoking status: Never   Smokeless tobacco: Never  Vaping Use   Vaping Use: Never used  Substance and Sexual Activity   Alcohol use: No    Comment: former use x20 years ago   Drug use: No   Sexual activity: Yes  Other Topics Concern   Not on file  Social History Narrative   Not on file   Social Determinants of Health   Financial Resource Strain: Low Risk  (12/30/2021)   Overall Financial Resource Strain (CARDIA)    Difficulty of Paying Living Expenses: Not hard at all  Food Insecurity: No Food Insecurity (12/30/2021)   Hunger Vital Sign    Worried About Running Out of Food in the Last Year: Never true    Ran Out of Food in the Last Year: Never true  Transportation Needs: No Transportation Needs (12/30/2021)   PRAPARE - Hydrologist (Medical): No    Lack of Transportation (Non-Medical): No  Physical Activity: Sufficiently Active (12/30/2021)   Exercise Vital Sign    Days of Exercise per Week: 4 days    Minutes of Exercise per Session:  50 min  Stress: No Stress Concern Present (12/30/2021)   [place]    Feeling of Stress : Not at all  Social Connections: Moderately Isolated (12/30/2021)   Social Connection and Isolation Panel [NHANES]    Frequency of Communication with Friends and Family: More than three times a week    Frequency of Social Gatherings with Friends and Family: More than three times a week    Attends Religious Services: Never    Marine scientist or Organizations: No    Attends Music therapist: Never    Marital Status: Married    Tobacco Counseling Counseling given: Not Answered   Clinical Intake:  Pre-visit preparation completed: Yes  Pain : No/denies pain     Nutritional Status: BMI 25 -29 Overweight Nutritional Risks: Other (Comment) (none)  How often do you need to have someone help you when you read instructions, pamphlets, or other written materials from your doctor or pharmacy?: 1 - Never What is the last grade level you completed in school?: 12th  Diabetic?No  Interpreter Needed?: No  Information entered by :: Emelia Loron RN   Activities of Daily Living    12/30/2021    3:37 PM  In your present state of health, do you have any difficulty performing the following activities:  Hearing? 0  Vision? 0  Difficulty concentrating or making decisions? 0  Walking or climbing stairs? 0  Dressing or bathing? 0  Doing errands, shopping? 0  Preparing Food and eating ? N  Using the Toilet? N  In the past six months, have you accidently leaked urine? N  Do you have problems with loss of bowel control? N  Managing your Medications? N  Managing your Finances? N  Housekeeping or managing your Housekeeping? N    Patient Care Team: Plotnikov, Evie Lacks, MD as PCP - General (Internal Medicine)  Indicate any recent Medical Services you may have received from other than [facility] providers in the past year  (date may be approximate).     Assessment:   This is a routine wellness examination for Dmitriy.  Hearing/Vision screen No results found.  Dietary issues and exercise activities discussed: Current Exercise Habits: Home exercise routine, Type of exercise: walking, Time (Minutes): 50, Frequency (Times/Week): 4, Weekly Exercise (Minutes/Week): 200, Intensity: Mild   Goals Addressed             This Visit's Progress    Patient Stated       Exercise more and eat healthier.       Depression Screen    12/30/2021  3:48 PM 12/27/2021    9:14 AM 11/21/2021    4:12 PM 11/16/2020    3:56 PM 04/01/2019    3:49 PM 10/25/2016    3:26 PM  PHQ 2/9 Scores  PHQ - 2 Score 0 0 0 0 0 0  PHQ- 9 Score  0  0      Fall Risk    12/30/2021    3:39 PM 12/27/2021    9:23 AM 12/27/2021    9:13 AM 11/21/2021    4:11 PM 11/16/2020    3:56 PM  Fall Risk   Falls in the past year? 0 0 0 0 0  Number falls in past yr: 0 0 0 0 0  Injury with Fall? 0 0 0 0 0  Risk for fall due to :  No Fall Risks No Fall Risks No Fall Risks No Fall Risks  Follow up  Falls evaluation completed Falls evaluation completed Falls evaluation completed     [place]:  Any stairs in or around the home? Yes  If so, are there any without handrails? Yes  Home free of loose throw rugs in walkways, pet beds, electrical cords, etc? Yes  Adequate lighting in your home to reduce risk of falls? Yes   ASSISTIVE DEVICES UTILIZED TO PREVENT FALLS:  Life alert? No  Use of a cane, walker or w/c? No  Grab bars in the bathroom? No  Shower chair or bench in shower? No  Elevated toilet seat or a handicapped toilet? No   Cognitive Function:        12/30/2021    3:39 PM  6CIT Screen  What Year? 0 points  What month? 0 points  What time? 0 points  Count back from 20 0 points  Months in reverse 0 points  Repeat phrase 0 points  Total Score 0 points    Immunizations Immunization History   Administered Date(s) Administered   PFIZER(Purple Top)SARS-COV-2 Vaccination 09/20/2019   Td 02/13/2005   Tdap 02/25/2015    Flu Vaccine status: Declined, Education has been provided regarding the importance of this vaccine but patient still declined. Advised may receive this vaccine at local pharmacy or Health Dept. Aware to provide a copy of the vaccination record if obtained from local pharmacy or Health Dept. Verbalized acceptance and understanding.  Pneumococcal vaccine status: Due, Education has been provided regarding the importance of this vaccine. Advised may receive this vaccine at local pharmacy or Health Dept. Aware to provide a copy of the vaccination record if obtained from local pharmacy or Health Dept. Verbalized acceptance and understanding.  Covid-19 vaccine status: Information provided on how to obtain vaccines.   Qualifies for Shingles Vaccine? Yes   Zostavax completed No   Shingrix Completed?: No.    Education has been provided regarding the importance of this vaccine. Patient has been advised to call insurance company to determine out of pocket expense if they have not yet received this vaccine. Advised may also receive vaccine at local pharmacy or Health Dept. Verbalized acceptance and understanding.  Screening Tests Health Maintenance  Topic Date Due   Zoster Vaccines- Shingrix (1 of 2) Never done   COVID-19 Vaccine (2 - Pfizer risk series) 10/11/2019   Fecal DNA (Cologuard)  12/05/2019   Pneumonia Vaccine 42+ Years old (1 - PCV) Never done   Medicare Annual Wellness (AWV)  12/31/2022   Hepatitis C Screening  Completed   HPV VACCINES  Aged Out   INFLUENZA VACCINE  Discontinued   COLONOSCOPY (Pts 45-18yr Insurance coverage will need to be confirmed)  Discontinued    Health Maintenance  Health Maintenance Due  Topic Date Due   Zoster Vaccines- Shingrix (1 of 2) Never done   COVID-19 Vaccine (2 - Pfizer risk series) 10/11/2019   Fecal DNA (Cologuard)   12/05/2019   Pneumonia Vaccine 66 Years old (1 - PCV) Never done    Colorectal cancer screening: Type of screening: Cologuard. Completed Patient states he is waiting to receive the kit. Repeat every 3 years  Lung Cancer Screening: (Low Dose CT Chest recommended if Age 66-80years, 30 pack-year currently smoking OR have quit w/in 15years.) does not qualify.   Additional Screening:  Hepatitis C Screening: does qualify; Completed 02/25/15  Vision Screening: Recommended annual ophthalmology exams for early detection of glaucoma and other disorders of the eye. Is the patient up to date with their annual eye exam?  Yes  Who is the provider or what is the name of the office in which the patient attends annual eye exams? Dr. BTalbert ForestIf pt is not established with a provider, would they like to be referred to a provider to establish care?  N/A .   Dental Screening: Recommended annual dental exams for proper oral hygiene  Community Resource Referral / Chronic Care Management: CRR required this visit?  No   CCM required this visit?  No      Plan:     I have personally reviewed and noted the following in the patient's chart:   Medical and social history Use of alcohol, tobacco or illicit drugs  Current medications and supplements including opioid prescriptions. Patient is not currently taking opioid prescriptions. Functional ability and status Nutritional status Physical activity Advanced directives List of other physicians Hospitalizations, surgeries, and ER visits in previous 12 months Vitals Screenings to include cognitive, depression, and falls Referrals and appointments  In addition, I have reviewed and discussed with patient certain preventive protocols, quality metrics, and best practice recommendations. A written personalized care plan for preventive services as well as general preventive health recommendations were provided to patient.     JMichiel Cowboy RN   12/30/2021    Nurse Notes:  Mr. GGetter, Thank you for taking time to come for your Medicare Wellness Visit. I appreciate your ongoing commitment to your health goals. Please review the following plan we discussed and let me know if I can assist you in the future.   These are the goals we discussed:  Goals      Patient Stated     Exercise more and eat healthier.         This is a list of the screening recommended for you and due dates:  Health Maintenance  Topic Date Due   Zoster (Shingles) Vaccine (1 of 2) Never done   COVID-19 Vaccine (2 - Pfizer risk series) 10/11/2019   Cologuard (Stool DNA test)  12/05/2019   Pneumonia Vaccine (1 - PCV) Never done   Medicare Annual Wellness Visit  12/31/2022   Hepatitis C Screening: USPSTF Recommendation to screen - Ages 172-79yo.  Completed   HPV Vaccine  Aged Out   Flu Shot  Discontinued   Colon Cancer Screening  Discontinued    Medical screening examination/treatment/procedure(s) were performed by non-physician practitioner and as supervising physician I was immediately available for consultation/collaboration.  I agree with above. ALew Dawes MD

## 2022-01-02 ENCOUNTER — Other Ambulatory Visit: Payer: Self-pay | Admitting: Internal Medicine

## 2022-01-12 DIAGNOSIS — M25551 Pain in right hip: Secondary | ICD-10-CM | POA: Insufficient documentation

## 2022-01-12 DIAGNOSIS — M25552 Pain in left hip: Secondary | ICD-10-CM | POA: Insufficient documentation

## 2022-01-13 DIAGNOSIS — M1612 Unilateral primary osteoarthritis, left hip: Secondary | ICD-10-CM | POA: Diagnosis not present

## 2022-01-13 DIAGNOSIS — M25552 Pain in left hip: Secondary | ICD-10-CM | POA: Diagnosis not present

## 2022-01-19 LAB — FECAL OCCULT BLOOD, IMMUNOCHEMICAL: IFOBT: NEGATIVE

## 2022-01-27 ENCOUNTER — Encounter: Payer: Self-pay | Admitting: Internal Medicine

## 2022-02-09 ENCOUNTER — Telehealth: Payer: Self-pay

## 2022-02-09 NOTE — Telephone Encounter (Signed)
Pt has stated he is in a lot of pain ad is getting his hip replaced soon. He has stated Aleve works better for  him then Advil. He is wanting to know how much can he take daily that will be safe for his kidney's?

## 2022-02-09 NOTE — Telephone Encounter (Signed)
1-2 Aleve tablets once or twice a day w/food. Thanks (max is 4 a day) Thx

## 2022-02-10 NOTE — Telephone Encounter (Signed)
Called pt and relayed Dr's instructions

## 2022-02-15 DIAGNOSIS — B078 Other viral warts: Secondary | ICD-10-CM | POA: Diagnosis not present

## 2022-02-15 DIAGNOSIS — C4442 Squamous cell carcinoma of skin of scalp and neck: Secondary | ICD-10-CM | POA: Diagnosis not present

## 2022-04-12 DIAGNOSIS — Z85828 Personal history of other malignant neoplasm of skin: Secondary | ICD-10-CM | POA: Diagnosis not present

## 2022-04-12 DIAGNOSIS — X32XXXD Exposure to sunlight, subsequent encounter: Secondary | ICD-10-CM | POA: Diagnosis not present

## 2022-04-12 DIAGNOSIS — L570 Actinic keratosis: Secondary | ICD-10-CM | POA: Diagnosis not present

## 2022-04-12 DIAGNOSIS — Z08 Encounter for follow-up examination after completed treatment for malignant neoplasm: Secondary | ICD-10-CM | POA: Diagnosis not present

## 2022-07-12 ENCOUNTER — Other Ambulatory Visit: Payer: Self-pay | Admitting: Internal Medicine

## 2022-07-12 DIAGNOSIS — D170 Benign lipomatous neoplasm of skin and subcutaneous tissue of head, face and neck: Secondary | ICD-10-CM | POA: Diagnosis not present

## 2022-08-09 DIAGNOSIS — L720 Epidermal cyst: Secondary | ICD-10-CM | POA: Diagnosis not present

## 2022-08-09 DIAGNOSIS — L570 Actinic keratosis: Secondary | ICD-10-CM | POA: Diagnosis not present

## 2022-08-09 DIAGNOSIS — X32XXXD Exposure to sunlight, subsequent encounter: Secondary | ICD-10-CM | POA: Diagnosis not present

## 2022-09-20 DIAGNOSIS — M1612 Unilateral primary osteoarthritis, left hip: Secondary | ICD-10-CM | POA: Diagnosis not present

## 2022-10-09 ENCOUNTER — Other Ambulatory Visit: Payer: Self-pay | Admitting: Internal Medicine

## 2022-10-11 DIAGNOSIS — L720 Epidermal cyst: Secondary | ICD-10-CM | POA: Diagnosis not present

## 2022-10-11 DIAGNOSIS — X32XXXD Exposure to sunlight, subsequent encounter: Secondary | ICD-10-CM | POA: Diagnosis not present

## 2022-10-11 DIAGNOSIS — L820 Inflamed seborrheic keratosis: Secondary | ICD-10-CM | POA: Diagnosis not present

## 2022-10-11 DIAGNOSIS — L570 Actinic keratosis: Secondary | ICD-10-CM | POA: Diagnosis not present

## 2022-10-23 ENCOUNTER — Telehealth: Payer: Self-pay | Admitting: Internal Medicine

## 2022-10-23 NOTE — Telephone Encounter (Signed)
We have received surgical clearance PW from [employer] and it has been placed in the providers box.   Please fax to: [phone]

## 2022-10-27 NOTE — Telephone Encounter (Signed)
I was able to speak with the pt and inform that we did receive his Surgical Clearance paperwork. However, Dr. Posey Rea will complete it at his apptmnt on 11/09/2022 due to pt needing to be seen.  Pt stated he understood.

## 2022-11-02 ENCOUNTER — Ambulatory Visit (INDEPENDENT_AMBULATORY_CARE_PROVIDER_SITE_OTHER): Payer: Medicare Other | Admitting: Family Medicine

## 2022-11-02 ENCOUNTER — Encounter: Payer: Self-pay | Admitting: Family Medicine

## 2022-11-02 VITALS — BP 144/66 | HR 65 | Temp 97.6°F | Ht 73.0 in | Wt 186.0 lb

## 2022-11-02 DIAGNOSIS — M1612 Unilateral primary osteoarthritis, left hip: Secondary | ICD-10-CM | POA: Diagnosis not present

## 2022-11-02 DIAGNOSIS — Z905 Acquired absence of kidney: Secondary | ICD-10-CM | POA: Diagnosis not present

## 2022-11-02 NOTE — Progress Notes (Signed)
Subjective:     Patient ID: Calvin Burke, male    DOB: [birth year], 67 y.o.   MRN: [id number]  Chief Complaint  Patient presents with   pre-surgical visit    Having hip replacement, left hip and lower back down to leg is giving him pain. At times pain is 10/10    HPI  Discussed the use of AI scribe software for clinical note transcription with the patient, who gave verbal consent to proceed.  History of Present Illness          Here with c/o pain. Having left hip replacement in late October.   Having issues sleeping. Sleeping in a chair due to pain of left hip.   Taking Tylenol arthritis every 8 hours. Using a heating pad and ice pack. Icy hot.   States he is still working and driving and does not want to take anything to impair his ability to do these things.   He only has one kidney. Concerned about medications harmful to his kidney.    Health Maintenance Due  Topic Date Due   Zoster Vaccines- Shingrix (1 of 2) Never done   Fecal DNA (Cologuard)  12/05/2019   Pneumonia Vaccine 62+ Years old (1 of 1 - PCV) Never done   Medicare Annual Wellness (AWV)  12/31/2022    Past Medical History:  Diagnosis Date   Cancer (HCC) 1982   testicular   Small bowel obstruction (HCC)    x 1998 & 2012    Past Surgical History:  Procedure Laterality Date   NEPHRECTOMY  1982   for cancer - left kidney removed   SURGERY SCROTAL / TESTICULAR  1982   for cancer    Family History  Problem Relation Age of Onset   Alcohol abuse Mother    Throat cancer Mother    Cancer Father    Heart disease Father        a fib   Diabetes Neg Hx    Early death Neg Hx    Hyperlipidemia Neg Hx    Hypertension Neg Hx    Kidney disease Neg Hx    Stroke Neg Hx     Social History   Socioeconomic History   Marital status: Married    Spouse name: Aurther Loft   Number of children: Not on file   Years of education: Not on file   Highest education level: Not on file  Occupational History   Not  on file  Tobacco Use   Smoking status: Never   Smokeless tobacco: Never  Vaping Use   Vaping status: Never Used  Substance and Sexual Activity   Alcohol use: No    Comment: former use x20 years ago   Drug use: No   Sexual activity: Yes  Other Topics Concern   Not on file  Social History Narrative   Not on file   Social Determinants of Health   Financial Resource Strain: Low Risk  (12/30/2021)   Overall Financial Resource Strain (CARDIA)    Difficulty of Paying Living Expenses: Not hard at all  Food Insecurity: No Food Insecurity (12/30/2021)   Hunger Vital Sign    Worried About Running Out of Food in the Last Year: Never true    Ran Out of Food in the Last Year: Never true  Transportation Needs: No Transportation Needs (12/30/2021)   PRAPARE - Administrator, Civil Service (Medical): No    Lack of Transportation (Non-Medical): No  Physical Activity: Sufficiently Active (  12/30/2021)   Exercise Vital Sign    Days of Exercise per Week: 4 days    Minutes of Exercise per Session: 50 min  Stress: No Stress Concern Present (12/30/2021)   [employer] of Occupational Health - Occupational Stress Questionnaire    Feeling of Stress : Not at all  Social Connections: Moderately Isolated (12/30/2021)   Social Connection and Isolation Panel [NHANES]    Frequency of Communication with Friends and Family: More than three times a week    Frequency of Social Gatherings with Friends and Family: More than three times a week    Attends Religious Services: Never    Database administrator or Organizations: No    Attends Banker Meetings: Never    Marital Status: Married  Catering manager Violence: Not At Risk (12/30/2021)   Humiliation, Afraid, Rape, and Kick questionnaire    Fear of Current or Ex-Partner: No    Emotionally Abused: No    Physically Abused: No    Sexually Abused: No    Outpatient Medications Prior to Visit  Medication Sig Dispense Refill    acetaminophen (TYLENOL) 500 MG tablet Take 1 tablet (500 mg total) by mouth every 8 (eight) hours as needed for moderate pain. 100 tablet 3   aspirin 81 MG EC tablet TAKE 1 TABLET (81 MG TOTAL) BY MOUTH DAILY. 90 tablet 0   Cholecalciferol (VITAMIN D3) 2000 units capsule Take 1 capsule (2,000 Units total) by mouth daily. 100 capsule 3   COLLAGEN PO Take by mouth.     finasteride (PROPECIA) 1 MG tablet Take 1 mg by mouth daily.     Omega-3 Fatty Acids (FISH OIL) 1000 MG CAPS Take by mouth.     sildenafil (VIAGRA) 100 MG tablet TAKE 1 TABLET BY MOUTH EVERY DAY AS NEEDED FOR ERECTILE DYSFUNCTION 8 tablet 1   vitamin B-12 (CYANOCOBALAMIN) 1000 MCG tablet Take 1,000 mcg by mouth daily.     amoxicillin-clavulanate (AUGMENTIN) 875-125 MG tablet Take 1 tablet by mouth 2 (two) times daily. 20 tablet 0   No facility-administered medications prior to visit.    Allergies  Allergen Reactions   Bleomycin Shortness Of Breath    Patient thinks had problems with this   Lipitor [Atorvastatin Calcium]     arthralgia   Simvastatin     Elevated LDH    Review of Systems  Constitutional:  Negative for chills and fever.  Respiratory:  Negative for shortness of breath.   Cardiovascular:  Negative for chest pain, palpitations and leg swelling.  Gastrointestinal:  Negative for abdominal pain, constipation, diarrhea, nausea and vomiting.  Genitourinary:  Negative for dysuria, frequency and urgency.  Musculoskeletal:  Positive for back pain and joint pain. Negative for falls.  Neurological:  Negative for dizziness, tingling and focal weakness.       Objective:    Physical Exam Constitutional:      General: He is not in acute distress.    Appearance: He is not ill-appearing.  Eyes:     Extraocular Movements: Extraocular movements intact.     Conjunctiva/sclera: Conjunctivae normal.  Cardiovascular:     Rate and Rhythm: Normal rate.  Pulmonary:     Effort: Pulmonary effort is normal.   Musculoskeletal:     Cervical back: Normal range of motion and neck supple.     Lumbar back: Normal. Normal range of motion.     Right hip: Normal.     Left hip: No tenderness. Decreased range of motion. Normal strength.  Right lower leg: No edema.     Left lower leg: No edema.     Comments: Pain with movement of left hip. Non tender. No decreased sensation or strength.   Skin:    General: Skin is warm and dry.  Neurological:     General: No focal deficit present.     Mental Status: He is alert and oriented to person, place, and time.     Cranial Nerves: No cranial nerve deficit.     Sensory: No sensory deficit.     Motor: No weakness.     Gait: Gait abnormal.  Psychiatric:        Mood and Affect: Mood normal.        Behavior: Behavior normal.        Thought Content: Thought content normal.      BP (!) 144/66 (BP Location: Left Arm, Patient Position: Sitting, Cuff Size: Large)   Pulse 65   Temp 97.6 F (36.4 C) (Temporal)   Ht 6\' 1"  (1.854 m)   Wt 186 lb (84.4 kg)   SpO2 98%   BMI 24.54 kg/m  Wt Readings from Last 3 Encounters:  11/02/22 186 lb (84.4 kg)  12/27/21 191 lb (86.6 kg)  11/21/21 195 lb (88.5 kg)       Assessment & Plan:   Problem List Items Addressed This Visit       Musculoskeletal and Integument   Osteoarthritis of left hip - Primary   Relevant Medications   traMADol (ULTRAM) 50 MG tablet   Other Relevant Orders   Basic metabolic panel     Other   History of left nephrectomy   Relevant Orders   Basic metabolic panel   Check renal function due to single kidney and his concern regarding taking pain medication. Avoid NSAIDs.  Continue Tylenol, heating pad, ice pack, topical medication.  He prefers to be able to drive and work. Pain is worse getting up in the mornings. Sleeping in a chair. Due for hip replacement in 5 weeks.  Tramadol prescribed to take for severe pain when not working and driving.    I have discontinued Jermyn L.  Doyle's amoxicillin-clavulanate. I am also having him start on traMADol. Additionally, I am having him maintain his aspirin EC, Fish Oil, Vitamin D3, cyanocobalamin, COLLAGEN PO, finasteride, acetaminophen, and sildenafil.  Meds ordered this encounter  Medications   traMADol (ULTRAM) 50 MG tablet    Sig: Take 1 tablet (50 mg total) by mouth every 8 (eight) hours as needed for up to 5 days.    Dispense:  15 tablet    Refill:  0    Order Specific Question:   Supervising Provider    Answer:   Hillard Danker A [[id number]]

## 2022-11-03 LAB — BASIC METABOLIC PANEL
BUN: 20 mg/dL (ref 6–23)
CO2: 26 mEq/L (ref 19–32)
Calcium: 10.1 mg/dL (ref 8.4–10.5)
Chloride: 100 mEq/L (ref 96–112)
Creatinine, Ser: 1 mg/dL (ref 0.40–1.50)
GFR: 77.99 mL/min (ref >60.00)
Glucose, Bld: 87 mg/dL (ref 70–99)
Potassium: 3.6 mEq/L (ref 3.5–5.1)
Sodium: 135 mEq/L (ref 135–145)

## 2022-11-03 MED ORDER — TRAMADOL HCL 50 MG PO TABS
50.0000 mg | ORAL_TABLET | Freq: Three times a day (TID) | ORAL | 0 refills | Status: AC | PRN
Start: 2022-11-03 — End: 2022-11-08

## 2022-11-03 NOTE — Progress Notes (Signed)
Please make sure he reads or is aware of my recommendations and the new medication I sent to his pharmacy.

## 2022-11-09 ENCOUNTER — Ambulatory Visit: Payer: Medicare Other | Admitting: Internal Medicine

## 2022-11-14 ENCOUNTER — Encounter: Payer: Self-pay | Admitting: Internal Medicine

## 2022-11-14 ENCOUNTER — Ambulatory Visit (INDEPENDENT_AMBULATORY_CARE_PROVIDER_SITE_OTHER): Payer: Medicare Other

## 2022-11-14 ENCOUNTER — Ambulatory Visit (INDEPENDENT_AMBULATORY_CARE_PROVIDER_SITE_OTHER): Payer: Medicare Other | Admitting: Internal Medicine

## 2022-11-14 VITALS — BP 120/70 | HR 61 | Temp 98.2°F | Ht 73.0 in | Wt 188.0 lb

## 2022-11-14 DIAGNOSIS — Z905 Acquired absence of kidney: Secondary | ICD-10-CM | POA: Diagnosis not present

## 2022-11-14 DIAGNOSIS — M1612 Unilateral primary osteoarthritis, left hip: Secondary | ICD-10-CM

## 2022-11-14 DIAGNOSIS — Z01818 Encounter for other preprocedural examination: Secondary | ICD-10-CM

## 2022-11-14 DIAGNOSIS — M1990 Unspecified osteoarthritis, unspecified site: Secondary | ICD-10-CM

## 2022-11-14 LAB — CBC WITH DIFFERENTIAL/PLATELET
Basophils Absolute: 0 10*3/uL (ref 0.0–0.1)
Basophils Relative: 0.5 % (ref 0.0–3.0)
Eosinophils Absolute: 0.1 10*3/uL (ref 0.0–0.7)
Eosinophils Relative: 1.4 % (ref 0.0–5.0)
HCT: 43.2 % (ref 39.0–52.0)
Hemoglobin: 14.7 g/dL (ref 13.0–17.0)
Lymphocytes Relative: 21.4 % (ref 12.0–46.0)
Lymphs Abs: 1.3 10*3/uL (ref 0.7–4.0)
MCHC: 34 g/dL (ref 30.0–36.0)
MCV: 94.7 fL (ref 78.0–100.0)
Monocytes Absolute: 0.5 10*3/uL (ref 0.1–1.0)
Monocytes Relative: 9.1 % (ref 3.0–12.0)
Neutro Abs: 4 10*3/uL (ref 1.4–7.7)
Neutrophils Relative %: 67.6 % (ref 43.0–77.0)
Platelets: 235 10*3/uL (ref 150.0–400.0)
RBC: 4.56 Mil/uL (ref 4.22–5.81)
RDW: 12.6 % (ref 11.5–15.5)
WBC: 6 10*3/uL (ref 4.0–10.5)

## 2022-11-14 LAB — COMPREHENSIVE METABOLIC PANEL
ALT: 22 U/L (ref 0–53)
AST: 21 U/L (ref 0–37)
Albumin: 4.3 g/dL (ref 3.5–5.2)
Alkaline Phosphatase: 54 U/L (ref 39–117)
BUN: 17 mg/dL (ref 6–23)
CO2: 26 meq/L (ref 19–32)
Calcium: 10.2 mg/dL (ref 8.4–10.5)
Chloride: 104 meq/L (ref 96–112)
Creatinine, Ser: 0.87 mg/dL (ref 0.40–1.50)
GFR: 89.4 mL/min (ref >60.00)
Glucose, Bld: 89 mg/dL (ref 70–99)
Potassium: 3.6 meq/L (ref 3.5–5.1)
Sodium: 138 meq/L (ref 135–145)
Total Bilirubin: 0.8 mg/dL (ref 0.2–1.2)
Total Protein: 6.9 g/dL (ref 6.0–8.3)

## 2022-11-14 LAB — URINALYSIS
Bilirubin Urine: NEGATIVE
Hgb urine dipstick: NEGATIVE
Ketones, ur: NEGATIVE
Leukocytes,Ua: NEGATIVE
Nitrite: NEGATIVE
Specific Gravity, Urine: <=1.005 — AB (ref 1.000–1.030)
Total Protein, Urine: NEGATIVE
Urine Glucose: NEGATIVE
Urobilinogen, UA: 0.2 (ref 0.0–1.0)
pH: 6 (ref 5.0–8.0)

## 2022-11-14 LAB — TSH: TSH: 0.93 u[IU]/mL (ref 0.35–5.50)

## 2022-11-14 LAB — PROTIME-INR
INR: 1.2 {ratio} — ABNORMAL HIGH (ref 0.8–1.0)
Prothrombin Time: 12.5 s (ref 9.6–13.1)

## 2022-11-14 NOTE — Assessment & Plan Note (Signed)
Avoid NSAIDs Monitor BMET

## 2022-11-14 NOTE — Assessment & Plan Note (Signed)
Pre-op for L THR by Dr Charlann Boxer in 3 weeks: Calvin Burke is clear for surgery Labs ordered, EKG, SOB He is clear for surgery if the test results are acceptable.  Thank you

## 2022-11-14 NOTE — Progress Notes (Signed)
Subjective:  Patient ID: Calvin Burke, male    DOB: [birth year]  Age: 67 y.o. MRN: [id number]  CC: Follow-up (Pre-Surgical visit )   HPI Calvin Burke presents for a pre-op for L THR by Dr Charlann Boxer in 3 weeks  Outpatient Medications Prior to Visit  Medication Sig Dispense Refill   acetaminophen (TYLENOL) 500 MG tablet Take 1 tablet (500 mg total) by mouth every 8 (eight) hours as needed for moderate pain. 100 tablet 3   aspirin 81 MG EC tablet TAKE 1 TABLET (81 MG TOTAL) BY MOUTH DAILY. 90 tablet 0   Cholecalciferol (VITAMIN D3) 2000 units capsule Take 1 capsule (2,000 Units total) by mouth daily. 100 capsule 3   COLLAGEN PO Take by mouth.     finasteride (PROPECIA) 1 MG tablet Take 1 mg by mouth daily.     Omega-3 Fatty Acids (FISH OIL) 1000 MG CAPS Take by mouth.     sildenafil (VIAGRA) 100 MG tablet TAKE 1 TABLET BY MOUTH EVERY DAY AS NEEDED FOR ERECTILE DYSFUNCTION 8 tablet 1   vitamin B-12 (CYANOCOBALAMIN) 1000 MCG tablet Take 1,000 mcg by mouth daily.     No facility-administered medications prior to visit.   Past Medical History:  Diagnosis Date   Cancer [facility]) 1982   testicular   Small bowel obstruction (HCC)    x 1998 & 2012   Past Surgical History:  Procedure Laterality Date   NEPHRECTOMY  1982   for cancer - left kidney removed   SURGERY SCROTAL / TESTICULAR  1982   for cancer    reports that he has never smoked. He has never used smokeless tobacco. He reports that he does not drink alcohol and does not use drugs. family history includes Alcohol abuse in his mother; Cancer in his father; Heart disease in his father; Throat cancer in his mother. Allergies  Allergen Reactions   Bleomycin Shortness Of Breath    Patient thinks had problems with this   Lipitor [Atorvastatin Calcium]     arthralgia   Simvastatin     Elevated LDH     ROS: Review of Systems  Constitutional:  Negative for appetite change, fatigue and unexpected weight change.  HENT:  Negative  for congestion, nosebleeds, sneezing, sore throat and trouble swallowing.   Eyes:  Negative for itching and visual disturbance.  Respiratory:  Negative for cough.   Cardiovascular:  Negative for chest pain, palpitations and leg swelling.  Gastrointestinal:  Negative for abdominal distention, blood in stool, diarrhea and nausea.  Genitourinary:  Negative for frequency and hematuria.  Musculoskeletal:  Positive for arthralgias and gait problem. Negative for back pain, joint swelling and neck pain.  Skin:  Negative for rash.  Neurological:  Negative for dizziness, tremors, speech difficulty and weakness.  Psychiatric/Behavioral:  Negative for agitation, dysphoric mood and sleep disturbance. The patient is not nervous/anxious.     Objective:  BP 120/70 (BP Location: Left Arm, Patient Position: Sitting, Cuff Size: Normal)   Pulse 61   Temp 98.2 F (36.8 C) (Oral)   Ht 6\' 1"  (1.854 m)   Wt 188 lb (85.3 kg)   SpO2 97%   BMI 24.80 kg/m   BP Readings from Last 3 Encounters:  11/14/22 120/70  11/02/22 (!) 144/66  12/27/21 124/70    Wt Readings from Last 3 Encounters:  11/14/22 188 lb (85.3 kg)  11/02/22 186 lb (84.4 kg)  12/27/21 191 lb (86.6 kg)    Physical Exam Constitutional:  General: He is not in acute distress.    Appearance: Normal appearance. He is well-developed.     Comments: NAD  Eyes:     Conjunctiva/sclera: Conjunctivae normal.     Pupils: Pupils are equal, round, and reactive to light.  Neck:     Thyroid: No thyromegaly.     Vascular: No JVD.  Cardiovascular:     Rate and Rhythm: Normal rate and regular rhythm.     Heart sounds: Normal heart sounds. No murmur heard.    No friction rub. No gallop.  Pulmonary:     Effort: Pulmonary effort is normal. No respiratory distress.     Breath sounds: Normal breath sounds. No wheezing or rales.  Chest:     Chest wall: No tenderness.  Abdominal:     General: Bowel sounds are normal. There is no distension.      Palpations: Abdomen is soft. There is no mass.     Tenderness: There is no abdominal tenderness. There is no guarding or rebound.  Musculoskeletal:        General: No tenderness. Normal range of motion.     Cervical back: Normal range of motion.  Lymphadenopathy:     Cervical: No cervical adenopathy.  Skin:    General: Skin is warm and dry.     Findings: No rash.  Neurological:     Mental Status: He is alert and oriented to person, place, and time.     Cranial Nerves: No cranial nerve deficit.     Motor: No abnormal muscle tone.     Coordination: Coordination normal.     Gait: Gait normal.     Deep Tendon Reflexes: Reflexes are normal and symmetric.  Psychiatric:        Behavior: Behavior normal.        Thought Content: Thought content normal.        Judgment: Judgment normal.   L hip w/pain Antalgic gait  Procedure: EKG Indication: pre-op Impression: S brady. No acute changes.   Lab Results  Component Value Date   WBC 6.2 11/21/2021   HGB 14.8 11/21/2021   HCT 41.2 11/21/2021   PLT 226.0 11/21/2021   GLUCOSE 87 11/02/2022   CHOL 213 (H) 11/21/2021   TRIG 308.0 (H) 11/21/2021   HDL 55.80 11/21/2021   LDLDIRECT 136.0 11/21/2021   LDLCALC 118 (H) 11/16/2020   ALT 21 11/21/2021   AST 19 11/21/2021   NA 135 11/02/2022   K 3.6 11/02/2022   CL 100 11/02/2022   CREATININE 1.00 11/02/2022   BUN 20 11/02/2022   CO2 26 11/02/2022   TSH 1.18 11/21/2021   PSA 0.24 11/21/2021   HGBA1C 5.3 03/07/2011    CT CARDIAC SCORING  Addendum Date: 07/23/2018   ADDENDUM REPORT: 07/23/2018 12:01 CLINICAL DATA:  Risk stratification EXAM: Coronary Calcium Score TECHNIQUE: The patient was scanned on a Siemens Somatom 64 slice scanner. Axial non-contrast 3 mm slices were carried out through the heart. The data set was analyzed on a dedicated work station and scored using the Agatson method. FINDINGS: Non-cardiac: See separate report from [facility]. Ascending aorta: Normal  diameter 3.5 cm Pericardium: Normal Coronary arteries: No calcium noted IMPRESSION: Coronary calcium score of 0. Charlton Haws Electronically Signed   By: Charlton Haws M.D.   On: 07/23/2018 12:01   Result Date: 07/23/2018 EXAM: OVER-READ INTERPRETATION  CT CHEST The following report is an over-read performed by radiologist Dr. Charlett Nose of [facility] on 07/23/2018. This over-read does  not include interpretation of cardiac or coronary anatomy or pathology. The coronary calcium score interpretation by the cardiologist is attached. COMPARISON:  None. FINDINGS: Vascular: Scattered calcifications in the descending thoracic aorta. Heart is normal size. Visualized aorta normal caliber. Mediastinum/Nodes: No adenopathy in the lower mediastinum or hila. Lungs/Pleura: No confluent opacities or effusions. Upper Abdomen: Imaging into the upper abdomen shows no acute findings. Musculoskeletal: Chest wall soft tissues are unremarkable. No acute bony abnormality. IMPRESSION: No acute extra cardiac abnormality. Scattered descending aortic atherosclerosis. Electronically Signed: By: Charlett Nose M.D. On: 07/23/2018 10:58    Assessment & Plan:   Problem List Items Addressed This Visit     History of left nephrectomy    Avoid NSAIDs Monitor BMET      Osteoarthritis    Tramadol prn  Potential benefits of a long term opioids use as well as potential risks (i.e. addiction risk, apnea etc) and complications (i.e. Somnolence, constipation and others) were explained to the patient and were aknowledged. Vit D      Relevant Orders   CBC with Differential/Platelet   Comprehensive metabolic panel   Protime-INR   Urinalysis   TSH   Osteoarthritis of left hip    Pre-op for L THR by Dr Charlann Boxer in 3 weeks: Calvin Burke is clear for surgery Labs ordered, EKG, SOB He is clear for surgery if the test results are acceptable.  Thank you      Relevant Orders   CBC with Differential/Platelet   Comprehensive  metabolic panel   Protime-INR   Urinalysis   TSH   Preop exam for internal medicine - Primary    Pre-op for L THR by Dr Charlann Boxer in 3 weeks: Calvin Burke is clear for surgery Labs ordered, EKG, SOB He is clear for surgery if the test results are acceptable.  Thank you      Relevant Orders   DG Chest 2 View   CBC with Differential/Platelet   Comprehensive metabolic panel   Protime-INR   Urinalysis   TSH   EKG 12-Lead      No orders of the defined types were placed in this encounter.     Follow-up: Return in about 4 months (around 03/17/2023) for f/u with PCP.  Sonda Primes, MD

## 2022-11-14 NOTE — Assessment & Plan Note (Signed)
Tramadol prn  Potential benefits of a long term opioids use as well as potential risks (i.e. addiction risk, apnea etc) and complications (i.e. Somnolence, constipation and others) were explained to the patient and were aknowledged. Vit D 

## 2022-11-24 DIAGNOSIS — L570 Actinic keratosis: Secondary | ICD-10-CM | POA: Diagnosis not present

## 2022-11-24 DIAGNOSIS — X32XXXD Exposure to sunlight, subsequent encounter: Secondary | ICD-10-CM | POA: Diagnosis not present

## 2022-11-24 DIAGNOSIS — L821 Other seborrheic keratosis: Secondary | ICD-10-CM | POA: Diagnosis not present

## 2022-11-24 NOTE — Telephone Encounter (Signed)
[facility] has called for a status update on the clearance forms and the ov/lab notes that were requested.   [facility] needs all documentation no later than next Thursday 10.19.24.   Please fax to: [phone]

## 2022-11-27 NOTE — Telephone Encounter (Signed)
She is clear for surgery.  I think we have faxed the form to them a while ago.  Thanks

## 2022-11-27 NOTE — Telephone Encounter (Signed)
Paperwork was faxed on 11/15/2022. I re-faxed all paperwork today.

## 2022-11-30 ENCOUNTER — Encounter: Payer: Medicare Other | Admitting: Internal Medicine

## 2022-12-07 DIAGNOSIS — M1612 Unilateral primary osteoarthritis, left hip: Secondary | ICD-10-CM | POA: Diagnosis not present

## 2022-12-07 DIAGNOSIS — M25452 Effusion, left hip: Secondary | ICD-10-CM | POA: Diagnosis not present

## 2022-12-07 DIAGNOSIS — M958 Other specified acquired deformities of musculoskeletal system: Secondary | ICD-10-CM | POA: Diagnosis not present

## 2023-01-09 ENCOUNTER — Other Ambulatory Visit: Payer: Self-pay | Admitting: Internal Medicine

## 2023-01-19 DIAGNOSIS — Z96642 Presence of left artificial hip joint: Secondary | ICD-10-CM | POA: Diagnosis not present

## 2023-01-19 DIAGNOSIS — M5416 Radiculopathy, lumbar region: Secondary | ICD-10-CM | POA: Diagnosis not present

## 2023-01-19 DIAGNOSIS — Z471 Aftercare following joint replacement surgery: Secondary | ICD-10-CM | POA: Diagnosis not present

## 2023-01-28 DIAGNOSIS — M5416 Radiculopathy, lumbar region: Secondary | ICD-10-CM | POA: Insufficient documentation

## 2023-03-02 DIAGNOSIS — Z96642 Presence of left artificial hip joint: Secondary | ICD-10-CM | POA: Diagnosis not present

## 2023-03-02 DIAGNOSIS — M51369 Other intervertebral disc degeneration, lumbar region without mention of lumbar back pain or lower extremity pain: Secondary | ICD-10-CM | POA: Insufficient documentation

## 2023-03-02 DIAGNOSIS — Z471 Aftercare following joint replacement surgery: Secondary | ICD-10-CM | POA: Diagnosis not present

## 2023-03-09 DIAGNOSIS — X32XXXD Exposure to sunlight, subsequent encounter: Secondary | ICD-10-CM | POA: Diagnosis not present

## 2023-03-09 DIAGNOSIS — C4441 Basal cell carcinoma of skin of scalp and neck: Secondary | ICD-10-CM | POA: Diagnosis not present

## 2023-03-09 DIAGNOSIS — L570 Actinic keratosis: Secondary | ICD-10-CM | POA: Diagnosis not present

## 2023-06-19 DIAGNOSIS — Z08 Encounter for follow-up examination after completed treatment for malignant neoplasm: Secondary | ICD-10-CM | POA: Diagnosis not present

## 2023-06-19 DIAGNOSIS — X32XXXD Exposure to sunlight, subsequent encounter: Secondary | ICD-10-CM | POA: Diagnosis not present

## 2023-06-19 DIAGNOSIS — Z85828 Personal history of other malignant neoplasm of skin: Secondary | ICD-10-CM | POA: Diagnosis not present

## 2023-06-19 DIAGNOSIS — L570 Actinic keratosis: Secondary | ICD-10-CM | POA: Diagnosis not present

## 2023-06-19 DIAGNOSIS — C4441 Basal cell carcinoma of skin of scalp and neck: Secondary | ICD-10-CM | POA: Diagnosis not present

## 2023-06-26 ENCOUNTER — Ambulatory Visit

## 2023-06-27 ENCOUNTER — Other Ambulatory Visit: Payer: Self-pay

## 2023-06-27 ENCOUNTER — Ambulatory Visit (HOSPITAL_COMMUNITY)
Admission: RE | Admit: 2023-06-27 | Discharge: 2023-06-27 | Disposition: A | Discharge status: Home / Self Care | Source: Ambulatory Visit | Attending: Vascular Surgery | Admitting: Vascular Surgery

## 2023-06-27 ENCOUNTER — Other Ambulatory Visit (HOSPITAL_COMMUNITY): Payer: Self-pay | Admitting: Medical

## 2023-06-27 ENCOUNTER — Encounter: Payer: Self-pay | Admitting: Medical

## 2023-06-27 DIAGNOSIS — M25579 Pain in unspecified ankle and joints of unspecified foot: Secondary | ICD-10-CM | POA: Insufficient documentation

## 2023-06-27 DIAGNOSIS — M79605 Pain in left leg: Secondary | ICD-10-CM

## 2023-06-27 DIAGNOSIS — M25572 Pain in left ankle and joints of left foot: Secondary | ICD-10-CM | POA: Diagnosis not present

## 2023-07-03 ENCOUNTER — Encounter: Admitting: Internal Medicine

## 2023-07-18 ENCOUNTER — Ambulatory Visit (INDEPENDENT_AMBULATORY_CARE_PROVIDER_SITE_OTHER)

## 2023-07-18 VITALS — Ht 73.0 in | Wt 188.0 lb

## 2023-07-18 DIAGNOSIS — Z0000 Encounter for general adult medical examination without abnormal findings: Secondary | ICD-10-CM | POA: Diagnosis not present

## 2023-07-18 NOTE — Progress Notes (Addendum)
 Subjective:   Calvin Burke is a 68 y.o. who presents for a Medicare Wellness preventive visit.  As a reminder, Annual Wellness Visits don't include a physical exam, and some assessments may be limited, especially if this visit is performed virtually. We may recommend an in-person follow-up visit with your provider if needed.  Visit Complete: Virtual I connected with  Arlyn Lambing on 07/18/23 by a audio enabled telemedicine application and verified that I am speaking with the correct person using two identifiers.  Patient Location: Home  Provider Location: Home Office  I discussed the limitations of evaluation and management by telemedicine. The patient expressed understanding and agreed to proceed.  Vital Signs: Because this visit was a virtual/telehealth visit, some criteria may be missing or patient reported. Any vitals not documented were not able to be obtained and vitals that have been documented are patient reported.  VideoDeclined- This patient declined Librarian, academic. Therefore the visit was completed with audio only.  Persons Participating in Visit: Patient.  AWV Questionnaire: No: Patient Medicare AWV questionnaire was not completed prior to this visit.  Cardiac Risk Factors include: advanced age (>81men, >28 women);male gender;dyslipidemia     Objective:     Today's Vitals   07/18/23 0805  Weight: 188 lb (85.3 kg)  Height: 6\' 1"  (1.854 m)   Body mass index is 24.8 kg/m.     07/18/2023    8:16 AM 12/30/2021    3:38 PM 03/06/2011    7:02 PM  Advanced Directives  Does Patient Have a Medical Advance Directive? No No Patient does not have advance directive;Patient would not like information  Would patient like information on creating a medical advance directive?  No - Patient declined     Current Medications (verified) Outpatient Encounter Medications as of 07/18/2023  Medication Sig   acetaminophen  (TYLENOL ) 500 MG tablet  Take 1 tablet (500 mg total) by mouth every 8 (eight) hours as needed for moderate pain.   aspirin  81 MG EC tablet TAKE 1 TABLET (81 MG TOTAL) BY MOUTH DAILY.   Cholecalciferol (VITAMIN D3) 2000 units capsule Take 1 capsule (2,000 Units total) by mouth daily.   COLLAGEN PO Take by mouth.   finasteride (PROPECIA) 1 MG tablet Take 1 mg by mouth daily.   Omega-3 Fatty Acids (FISH OIL) 1000 MG CAPS Take by mouth.   sildenafil  (VIAGRA ) 100 MG tablet TAKE 1 TABLET BY MOUTH EVERY DAY AS NEEDED FOR ERECTILE DYSFUNCTION   tadalafil (CIALIS) 5 MG tablet Take 5 mg by mouth daily.   vitamin B-12 (CYANOCOBALAMIN) 1000 MCG tablet Take 1,000 mcg by mouth daily.   Testosterone Enanthate 100 MG/0.5ML SOAJ Inject by subcutaneous route.   No facility-administered encounter medications on file as of 07/18/2023.    Allergies (verified) Bleomycin, Lipitor [atorvastatin  calcium ], and Simvastatin    History: Past Medical History:  Diagnosis Date   Cancer (HCC) 1982   testicular   Small bowel obstruction (HCC)    x 1998 & 2012   Past Surgical History:  Procedure Laterality Date   NEPHRECTOMY  1982   for cancer - left kidney removed   SURGERY SCROTAL / TESTICULAR  1982   for cancer   Family History  Problem Relation Age of Onset   Alcohol abuse Mother    Throat cancer Mother    Cancer Father    Heart disease Father        a fib   Diabetes Neg Hx    Early death  Neg Hx    Hyperlipidemia Neg Hx    Hypertension Neg Hx    Kidney disease Neg Hx    Stroke Neg Hx    Social History   Socioeconomic History   Marital status: Married    Spouse name: Blaise Bumps   Number of children: Not on file   Years of education: Not on file   Highest education level: Not on file  Occupational History   Not on file  Tobacco Use   Smoking status: Never   Smokeless tobacco: Never  Vaping Use   Vaping status: Never Used  Substance and Sexual Activity   Alcohol use: No    Comment: former use x20 years ago   Drug use:  No   Sexual activity: Yes  Other Topics Concern   Not on file  Social History Narrative   Lives with wife and 1 dog/2025   Social Drivers of Health   Financial Resource Strain: Low Risk  (07/18/2023)   Overall Financial Resource Strain (CARDIA)    Difficulty of Paying Living Expenses: Not hard at all  Food Insecurity: No Food Insecurity (07/18/2023)   Hunger Vital Sign    Worried About Running Out of Food in the Last Year: Never true    Ran Out of Food in the Last Year: Never true  Transportation Needs: No Transportation Needs (07/18/2023)   PRAPARE - Administrator, Civil Service (Medical): No    Lack of Transportation (Non-Medical): No  Physical Activity: Sufficiently Active (12/30/2021)   Exercise Vital Sign    Days of Exercise per Week: 4 days    Minutes of Exercise per Session: 50 min  Stress: No Stress Concern Present (07/18/2023)   [employer] of Occupational Health - Occupational Stress Questionnaire    Feeling of Stress : Not at all  Social Connections: Moderately Isolated (07/18/2023)   Social Connection and Isolation Panel [NHANES]    Frequency of Communication with Friends and Family: More than three times a week    Frequency of Social Gatherings with Friends and Family: Once a week    Attends Religious Services: Never    Database administrator or Organizations: No    Attends Engineer, structural: Never    Marital Status: Married    Tobacco Counseling Counseling given: Not Answered    Clinical Intake:  Pre-visit preparation completed: Yes  Pain : No/denies pain     BMI - recorded: 24.8 Nutritional Status: BMI of 19-24  Normal Nutritional Risks: None Diabetes: No  Lab Results  Component Value Date   HGBA1C 5.3 03/07/2011     How often do you need to have someone help you when you read instructions, pamphlets, or other written materials from your doctor or pharmacy?: 1 - Never  Interpreter Needed?: No  Information entered by ::  Lylah Lantis, RMA   Activities of Daily Living     07/18/2023    8:05 AM  In your present state of health, do you have any difficulty performing the following activities:  Hearing? 1  Comment some hearing loss  Vision? 0  Difficulty concentrating or making decisions? 0  Walking or climbing stairs? 0  Dressing or bathing? 0  Doing errands, shopping? 0  Preparing Food and eating ? N  Using the Toilet? N  In the past six months, have you accidently leaked urine? N  Do you have problems with loss of bowel control? N  Managing your Medications? N  Managing your Finances? N  Housekeeping or managing your Housekeeping? N    Patient Care Team: Plotnikov, Oakley Bellman, MD as PCP - General (Internal Medicine)  I have updated your Care Teams any recent Medical Services you may have received from other providers in the past year.     Assessment:    This is a routine wellness examination for [place].  Hearing/Vision screen Hearing Screening - Comments:: Some hearing loss -per pt Vision Screening - Comments:: Denies vision issues./Looking for a doctor    Goals Addressed   None    Depression Screen     07/18/2023    8:18 AM 11/02/2022    4:03 PM 12/30/2021    3:48 PM 12/27/2021    9:14 AM 11/21/2021    4:12 PM 11/16/2020    3:56 PM 04/01/2019    3:49 PM  PHQ 2/9 Scores  PHQ - 2 Score 0 0 0 0 0 0 0  PHQ- 9 Score 0   0  0     Fall Risk     07/18/2023    8:16 AM 11/02/2022    4:03 PM 12/30/2021    3:39 PM 12/27/2021    9:23 AM 12/27/2021    9:13 AM  Fall Risk   Falls in the past year? 0 0 0 0 0  Number falls in past yr: 0 0 0 0 0  Injury with Fall? 0 0 0 0 0  Risk for fall due to :  No Fall Risks  No Fall Risks No Fall Risks  Follow up Falls evaluation completed;Falls prevention discussed Falls evaluation completed  Falls evaluation completed Falls evaluation completed    MEDICARE RISK AT HOME:  Medicare Risk at Home Any stairs in or around the home?: Yes (basment) If so,  are there any without handrails?: Yes Home free of loose throw rugs in walkways, pet beds, electrical cords, etc?: Yes Adequate lighting in your home to reduce risk of falls?: Yes Life alert?: No Use of a cane, walker or w/c?: No Grab bars in the bathroom?: No Shower chair or bench in shower?: No Elevated toilet seat or a handicapped toilet?: Yes  TIMED UP AND GO:  Was the test performed?  No  Cognitive Function: Declined/Normal: No cognitive concerns noted by patient or family. Patient alert, oriented, able to answer questions appropriately and recall recent events. No signs of memory loss or confusion.        12/30/2021    3:39 PM  6CIT Screen  What Year? 0 points  What month? 0 points  What time? 0 points  Count back from 20 0 points  Months in reverse 0 points  Repeat phrase 0 points  Total Score 0 points    Immunizations Immunization History  Administered Date(s) Administered   PFIZER(Purple Top)SARS-COV-2 Vaccination 09/20/2019   Td 02/13/2005   Tdap 02/25/2015    Screening Tests Health Maintenance  Topic Date Due   Pneumonia Vaccine 40+ Years old (1 of 1 - PCV) Never done   COVID-19 Vaccine (2 - Pfizer risk series) 10/11/2019   Fecal DNA (Cologuard)  12/05/2019   Medicare Annual Wellness (AWV)  07/17/2024   DTaP/Tdap/Td (3 - Td or Tdap) 02/24/2025   Hepatitis C Screening  Completed   HPV VACCINES  Aged Out   Meningococcal B Vaccine  Aged Out   INFLUENZA VACCINE  Discontinued   Colonoscopy  Discontinued   Zoster Vaccines- Shingrix  Discontinued    Health Maintenance  Health Maintenance Due  Topic Date Due  Pneumonia Vaccine 18+ Years old (1 of 1 - PCV) Never done   COVID-19 Vaccine (2 - Pfizer risk series) 10/11/2019   Fecal DNA (Cologuard)  12/05/2019   Health Maintenance Items Addressed: See Nurse Notes at the end of this note  Additional Screening:  Vision Screening: Recommended annual ophthalmology exams for early detection of glaucoma and  other disorders of the eye. Would you like a referral to an eye doctor? Yes    Dental Screening: Recommended annual dental exams for proper oral hygiene  Community Resource Referral / Chronic Care Management: CRR required this visit?  No   CCM required this visit?  No   Plan:    I have personally reviewed and noted the following in the patient's chart:   Medical and social history Use of alcohol, tobacco or illicit drugs  Current medications and supplements including opioid prescriptions. Patient is not currently taking opioid prescriptions. Functional ability and status Nutritional status Physical activity Advanced directives List of other physicians Hospitalizations, surgeries, and ER visits in previous 12 months Vitals Screenings to include cognitive, depression, and falls Referrals and appointments  In addition, I have reviewed and discussed with patient certain preventive protocols, quality metrics, and best practice recommendations. A written personalized care plan for preventive services as well as general preventive health recommendations were provided to patient.   Mischa Brittingham L Adriane Gabbert, CMA   07/18/2023   After Visit Summary: (MyChart) Due to this being a telephonic visit, the after visit summary with patients personalized plan was offered to patient via MyChart   Notes: Please refer to Routing Comments.  Medical screening examination/treatment/procedure(s) were performed by non-physician practitioner and as supervising physician I was immediately available for consultation/collaboration.  I agree with above. Adelaide Holy, MD

## 2023-07-18 NOTE — Patient Instructions (Signed)
 Calvin Burke , Thank you for taking time out of your busy schedule to complete your Annual Wellness Visit with me. I enjoyed our conversation and look forward to speaking with you again next year. I, as well as your care team,  appreciate your ongoing commitment to your health goals. Please review the following plan we discussed and let me know if I can assist you in the future. Your Game plan/ To Do List   Follow up Visits: Next Medicare AWV with our clinical staff: 07/21/2024.   Have you seen your provider in the last 6 months (3 months if uncontrolled diabetes)? No Next Office Visit with your provider: 07/24/2023.  Clinician Recommendations:  Aim for 30 minutes of exercise or brisk walking, 6-8 glasses of water, and 5 servings of fruits and vegetables each day.       This is a list of the screening recommended for you and due dates:  Health Maintenance  Topic Date Due   Pneumonia Vaccine (1 of 1 - PCV) Never done   COVID-19 Vaccine (2 - Pfizer risk series) 10/11/2019   Cologuard (Stool DNA test)  12/05/2019   Medicare Annual Wellness Visit  07/17/2024   DTaP/Tdap/Td vaccine (3 - Td or Tdap) 02/24/2025   Hepatitis C Screening  Completed   HPV Vaccine  Aged Out   Meningitis B Vaccine  Aged Out   Flu Shot  Discontinued   Colon Cancer Screening  Discontinued   Zoster (Shingles) Vaccine  Discontinued    Advanced directives: (Declined) Advance directive discussed with you today. Even though you declined this today, please call our office should you change your mind, and we can give you the proper paperwork for you to fill out. Advance Care Planning is important because it:  [x]  Makes sure you receive the medical care that is consistent with your values, goals, and preferences  [x]  It provides guidance to your family and loved ones and reduces their decisional burden about whether or not they are making the right decisions based on your wishes.  Follow the link provided in your after visit  summary or read over the paperwork we have mailed to you to help you started getting your Advance Directives in place. If you need assistance in completing these, please reach out to us  so that we can help you!  See attachments for Preventive Care and Fall Prevention Tips.

## 2023-07-24 ENCOUNTER — Ambulatory Visit (INDEPENDENT_AMBULATORY_CARE_PROVIDER_SITE_OTHER): Admitting: Internal Medicine

## 2023-07-24 ENCOUNTER — Encounter: Payer: Self-pay | Admitting: Internal Medicine

## 2023-07-24 VITALS — BP 120/78 | HR 66 | Temp 97.8°F | Ht 73.0 in | Wt 191.0 lb

## 2023-07-24 DIAGNOSIS — H269 Unspecified cataract: Secondary | ICD-10-CM | POA: Diagnosis not present

## 2023-07-24 DIAGNOSIS — R600 Localized edema: Secondary | ICD-10-CM | POA: Diagnosis not present

## 2023-07-24 DIAGNOSIS — M545 Low back pain, unspecified: Secondary | ICD-10-CM

## 2023-07-24 DIAGNOSIS — G8929 Other chronic pain: Secondary | ICD-10-CM

## 2023-07-24 LAB — COMPREHENSIVE METABOLIC PANEL WITH GFR
ALT: 17 U/L (ref 0–53)
AST: 20 U/L (ref 0–37)
Albumin: 4.2 g/dL (ref 3.5–5.2)
Alkaline Phosphatase: 53 U/L (ref 39–117)
BUN: 15 mg/dL (ref 6–23)
CO2: 27 meq/L (ref 19–32)
Calcium: 9.9 mg/dL (ref 8.4–10.5)
Chloride: 105 meq/L (ref 96–112)
Creatinine, Ser: 0.91 mg/dL (ref 0.40–1.50)
GFR: 86.9 mL/min (ref >60.00)
Glucose, Bld: 113 mg/dL — ABNORMAL HIGH (ref 70–99)
Potassium: 4 meq/L (ref 3.5–5.1)
Sodium: 139 meq/L (ref 135–145)
Total Bilirubin: 0.6 mg/dL (ref 0.2–1.2)
Total Protein: 6.7 g/dL (ref 6.0–8.3)

## 2023-07-24 MED ORDER — FUROSEMIDE 20 MG PO TABS: 20.0000 mg | ORAL_TABLET | Freq: Every day | ORAL | 3 refills | Status: DC | PRN

## 2023-07-24 NOTE — Patient Instructions (Signed)
 [employer] [facility] [employer] Ank[employer] Building services engineer Socks 3 Pairs 4.3 4.3 out of 5 stars    412 ratings [phone] $5.00 per count($5.00 / count)

## 2023-07-24 NOTE — Assessment & Plan Note (Signed)
 B ankles - new Furosemide prn Compr socks  Labs w/TSH Elevate legs Venous Doppler ordered

## 2023-07-24 NOTE — Progress Notes (Signed)
 Subjective:  Patient ID: Calvin Burke, male    DOB: [birth year]  Age: 68 y.o. MRN: [id number]  CC: Medical Management of Chronic Issues (6 MNTH F/U, Pt has a concern about his left leg which has been swelling off and on since his hip surgery in Feb)   HPI Calvin Burke presents for leg edema L>R since the fall of last year. S/p L THR in 2024.  Outpatient Medications Prior to Visit  Medication Sig Dispense Refill   acetaminophen  (TYLENOL ) 500 MG tablet Take 1 tablet (500 mg total) by mouth every 8 (eight) hours as needed for moderate pain. 100 tablet 3   aspirin  81 MG EC tablet TAKE 1 TABLET (81 MG TOTAL) BY MOUTH DAILY. 90 tablet 0   Cholecalciferol (VITAMIN D3) 2000 units capsule Take 1 capsule (2,000 Units total) by mouth daily. 100 capsule 3   COLLAGEN PO Take by mouth.     finasteride (PROPECIA) 1 MG tablet Take 1 mg by mouth daily.     Omega-3 Fatty Acids (FISH OIL) 1000 MG CAPS Take by mouth.     sildenafil  (VIAGRA ) 100 MG tablet TAKE 1 TABLET BY MOUTH EVERY DAY AS NEEDED FOR ERECTILE DYSFUNCTION 8 tablet 1   tadalafil (CIALIS) 5 MG tablet Take 5 mg by mouth daily.     Testosterone Enanthate 100 MG/0.5ML SOAJ Inject by subcutaneous route.     vitamin B-12 (CYANOCOBALAMIN) 1000 MCG tablet Take 1,000 mcg by mouth daily.     No facility-administered medications prior to visit.    ROS: Review of Systems  Constitutional:  Negative for appetite change, fatigue and unexpected weight change.  HENT:  Negative for congestion, nosebleeds, sneezing, sore throat and trouble swallowing.   Eyes:  Negative for itching and visual disturbance.  Respiratory:  Negative for cough.   Cardiovascular:  Positive for leg swelling. Negative for chest pain and palpitations.  Gastrointestinal:  Negative for abdominal distention, blood in stool, diarrhea and nausea.  Genitourinary:  Negative for frequency and hematuria.  Musculoskeletal:  Negative for back pain, gait problem, joint swelling and  neck pain.  Skin:  Negative for rash.  Neurological:  Negative for dizziness, tremors, speech difficulty and weakness.  Psychiatric/Behavioral:  Negative for agitation, dysphoric mood and sleep disturbance. The patient is not nervous/anxious.     Objective:  BP 120/78   Pulse 66   Temp 97.8 F (36.6 C) (Oral)   Ht 6\' 1"  (1.854 m)   Wt 191 lb (86.6 kg)   SpO2 96%   BMI 25.20 kg/m   BP Readings from Last 3 Encounters:  07/24/23 120/78  11/14/22 120/70  11/02/22 (!) 144/66    Wt Readings from Last 3 Encounters:  07/24/23 191 lb (86.6 kg)  07/18/23 188 lb (85.3 kg)  11/14/22 188 lb (85.3 kg)    Physical Exam Constitutional:      General: He is not in acute distress.    Appearance: Normal appearance. He is well-developed.     Comments: NAD  Eyes:     Conjunctiva/sclera: Conjunctivae normal.     Pupils: Pupils are equal, round, and reactive to light.  Neck:     Thyroid : No thyromegaly.     Vascular: No JVD.  Cardiovascular:     Rate and Rhythm: Normal rate and regular rhythm.     Heart sounds: Normal heart sounds. No murmur heard.    No friction rub. No gallop.  Pulmonary:     Effort: Pulmonary effort is normal. No respiratory  distress.     Breath sounds: Normal breath sounds. No wheezing or rales.  Chest:     Chest wall: No tenderness.  Abdominal:     General: Bowel sounds are normal. There is no distension.     Palpations: Abdomen is soft. There is no mass.     Tenderness: There is no abdominal tenderness. There is no guarding or rebound.  Musculoskeletal:        General: No tenderness. Normal range of motion.     Cervical back: Normal range of motion.     Right lower leg: Edema present.     Left lower leg: Edema present.  Lymphadenopathy:     Cervical: No cervical adenopathy.  Skin:    General: Skin is warm and dry.     Findings: No rash.  Neurological:     Mental Status: He is alert and oriented to person, place, and time.     Cranial Nerves: No cranial  nerve deficit.     Motor: No abnormal muscle tone.     Coordination: Coordination normal.     Gait: Gait normal.     Deep Tendon Reflexes: Reflexes are normal and symmetric.  Psychiatric:        Behavior: Behavior normal.        Thought Content: Thought content normal.        Judgment: Judgment normal.   Ankles w/trace edema, mild discoloration  Lab Results  Component Value Date   WBC 6.0 11/14/2022   HGB 14.7 11/14/2022   HCT 43.2 11/14/2022   PLT 235.0 11/14/2022   GLUCOSE 89 11/14/2022   CHOL 213 (H) 11/21/2021   TRIG 308.0 (H) 11/21/2021   HDL 55.80 11/21/2021   LDLDIRECT 136.0 11/21/2021   LDLCALC 118 (H) 11/16/2020   ALT 22 11/14/2022   AST 21 11/14/2022   NA 138 11/14/2022   K 3.6 11/14/2022   CL 104 11/14/2022   CREATININE 0.87 11/14/2022   BUN 17 11/14/2022   CO2 26 11/14/2022   TSH 0.93 11/14/2022   PSA 0.24 11/21/2021   INR 1.2 (H) 11/14/2022   HGBA1C 5.3 03/07/2011    VAS US  LOWER EXTREMITY VENOUS (DVT) Result Date: 06/27/2023  Lower Venous DVT Study Patient Name:  Calvin Burke  Date of Exam:   06/27/2023 Medical Rec #: [id number]         Accession #:    [id number] Date of Birth: 1955-08-08          Patient Gender: M Patient Age:   1 years Exam Location:  [address] Procedure:      VAS US  LOWER EXTREMITY VENOUS (DVT) Referring Phys: Calvin Burke --------------------------------------------------------------------------------  Indications: Left anteriolateral pain in shin for 3 weeks. Patient had left hip replaced back in 11/2022. He denies chest pain and SOB.  Comparison Study: None Performing Technologist: Alecia Mackin RVT, RDCS (AE), RDMS  Examination Guidelines: A complete evaluation includes B-mode imaging, spectral Doppler, color Doppler, and power Doppler as needed of all accessible portions of each vessel. Bilateral testing is considered an integral part of a complete examination. Limited examinations for reoccurring indications may be performed as  noted. The reflux portion of the exam is performed with the patient in reverse Trendelenburg.  +-----+---------------+---------+-----------+----------+--------------+ RIGHTCompressibilityPhasicitySpontaneityPropertiesThrombus Aging +-----+---------------+---------+-----------+----------+--------------+ CFV  Full           Yes      Yes                                 +-----+---------------+---------+-----------+----------+--------------+   +---------+---------------+---------+-----------+----------+--------------+  LEFT     CompressibilityPhasicitySpontaneityPropertiesThrombus Aging +---------+---------------+---------+-----------+----------+--------------+ CFV      Full           Yes      Yes                                 +---------+---------------+---------+-----------+----------+--------------+ SFJ      Full           Yes      Yes                                 +---------+---------------+---------+-----------+----------+--------------+ FV Prox  Full           Yes      Yes                                 +---------+---------------+---------+-----------+----------+--------------+ FV Mid   Full           Yes      Yes                                 +---------+---------------+---------+-----------+----------+--------------+ FV DistalFull           Yes      Yes                                 +---------+---------------+---------+-----------+----------+--------------+ PFV      Full                                                        +---------+---------------+---------+-----------+----------+--------------+ POP      Full           Yes      Yes                                 +---------+---------------+---------+-----------+----------+--------------+ PTV      Full           Yes      Yes                                 +---------+---------------+---------+-----------+----------+--------------+ PERO     Full           Yes      Yes                                  +---------+---------------+---------+-----------+----------+--------------+ Gastroc  Full                                                        +---------+---------------+---------+-----------+----------+--------------+ GSV      Full                                                        +---------+---------------+---------+-----------+----------+--------------+  Findings reported to preliminary faxed to [facility] office at 11:50 am.  Summary: RIGHT: - No evidence of common femoral vein obstruction.   LEFT: - No evidence of deep vein thrombosis in the lower extremity. No indirect evidence of obstruction proximal to the inguinal ligament.  - No cystic structure found in the popliteal fossa.  *See table(s) above for measurements and observations. Electronically signed by Angela Kell MD on 06/27/2023 at 1:23:17 PM.    Final     Assessment & Plan:   Problem List Items Addressed This Visit     Low back pain   S/p L THR Continue with Tylenol .  Safe Tylenol  single dose and maximum daily dose were discussed       Cataract of both eyes   Ophth ref - Dr Lenna Quinton      Relevant Orders   Ambulatory referral to Ophthalmology   Localized edema - Primary   B ankles - new Furosemide prn Compr socks  Labs w/TSH Elevate legs Venous Doppler ordered      Relevant Orders   Comprehensive metabolic panel with GFR   TSH      Meds ordered this encounter  Medications   furosemide (LASIX) 20 MG tablet    Sig: Take 1 tablet (20 mg total) by mouth daily as needed for edema.    Dispense:  30 tablet    Refill:  3      Follow-up: Return in about 3 months (around 10/24/2023) for a follow-up visit.  Anitra Barn, MD

## 2023-07-24 NOTE — Assessment & Plan Note (Signed)
Ophth ref -- Dr Talbert Forest

## 2023-07-24 NOTE — Assessment & Plan Note (Signed)
 S/p L THR Continue with Tylenol .  Safe Tylenol  single dose and maximum daily dose were discussed

## 2023-07-26 LAB — TSH: TSH: 1.06 u[IU]/mL (ref 0.35–5.50)

## 2023-07-30 ENCOUNTER — Ambulatory Visit: Payer: Self-pay | Admitting: Internal Medicine

## 2023-07-31 DIAGNOSIS — L570 Actinic keratosis: Secondary | ICD-10-CM | POA: Diagnosis not present

## 2023-07-31 DIAGNOSIS — C4441 Basal cell carcinoma of skin of scalp and neck: Secondary | ICD-10-CM | POA: Diagnosis not present

## 2023-07-31 DIAGNOSIS — X32XXXD Exposure to sunlight, subsequent encounter: Secondary | ICD-10-CM | POA: Diagnosis not present

## 2023-08-22 DIAGNOSIS — M5416 Radiculopathy, lumbar region: Secondary | ICD-10-CM | POA: Diagnosis not present

## 2023-09-25 DIAGNOSIS — Z85828 Personal history of other malignant neoplasm of skin: Secondary | ICD-10-CM | POA: Diagnosis not present

## 2023-09-25 DIAGNOSIS — Z08 Encounter for follow-up examination after completed treatment for malignant neoplasm: Secondary | ICD-10-CM | POA: Diagnosis not present

## 2023-09-25 DIAGNOSIS — C4441 Basal cell carcinoma of skin of scalp and neck: Secondary | ICD-10-CM | POA: Diagnosis not present

## 2023-09-27 DIAGNOSIS — M5416 Radiculopathy, lumbar region: Secondary | ICD-10-CM | POA: Diagnosis not present

## 2023-10-23 DIAGNOSIS — C4441 Basal cell carcinoma of skin of scalp and neck: Secondary | ICD-10-CM | POA: Diagnosis not present

## 2023-10-26 ENCOUNTER — Other Ambulatory Visit: Payer: Self-pay | Admitting: Internal Medicine

## 2023-11-07 DIAGNOSIS — L928 Other granulomatous disorders of the skin and subcutaneous tissue: Secondary | ICD-10-CM | POA: Diagnosis not present

## 2023-11-07 DIAGNOSIS — C4441 Basal cell carcinoma of skin of scalp and neck: Secondary | ICD-10-CM | POA: Diagnosis not present

## 2023-11-16 DIAGNOSIS — C4441 Basal cell carcinoma of skin of scalp and neck: Secondary | ICD-10-CM | POA: Diagnosis not present

## 2023-11-26 DIAGNOSIS — C4441 Basal cell carcinoma of skin of scalp and neck: Secondary | ICD-10-CM | POA: Diagnosis not present

## 2023-11-26 DIAGNOSIS — C4491 Basal cell carcinoma of skin, unspecified: Secondary | ICD-10-CM | POA: Diagnosis not present

## 2023-11-29 DIAGNOSIS — C4441 Basal cell carcinoma of skin of scalp and neck: Secondary | ICD-10-CM | POA: Diagnosis not present

## 2023-11-29 DIAGNOSIS — C4491 Basal cell carcinoma of skin, unspecified: Secondary | ICD-10-CM | POA: Diagnosis not present

## 2023-12-06 DIAGNOSIS — C4491 Basal cell carcinoma of skin, unspecified: Secondary | ICD-10-CM | POA: Diagnosis not present

## 2023-12-06 DIAGNOSIS — C4441 Basal cell carcinoma of skin of scalp and neck: Secondary | ICD-10-CM | POA: Diagnosis not present

## 2023-12-17 DIAGNOSIS — Z905 Acquired absence of kidney: Secondary | ICD-10-CM | POA: Diagnosis not present

## 2023-12-17 DIAGNOSIS — Z9221 Personal history of antineoplastic chemotherapy: Secondary | ICD-10-CM | POA: Diagnosis not present

## 2023-12-17 DIAGNOSIS — C4441 Basal cell carcinoma of skin of scalp and neck: Secondary | ICD-10-CM | POA: Diagnosis not present

## 2023-12-17 DIAGNOSIS — Z8547 Personal history of malignant neoplasm of testis: Secondary | ICD-10-CM | POA: Diagnosis not present

## 2023-12-21 ENCOUNTER — Other Ambulatory Visit: Payer: Self-pay | Admitting: Internal Medicine

## 2023-12-27 DIAGNOSIS — C760 Malignant neoplasm of head, face and neck: Secondary | ICD-10-CM | POA: Diagnosis not present

## 2023-12-27 DIAGNOSIS — Z510 Encounter for antineoplastic radiation therapy: Secondary | ICD-10-CM | POA: Diagnosis not present

## 2024-01-01 DIAGNOSIS — Z510 Encounter for antineoplastic radiation therapy: Secondary | ICD-10-CM | POA: Diagnosis not present

## 2024-01-01 DIAGNOSIS — C760 Malignant neoplasm of head, face and neck: Secondary | ICD-10-CM | POA: Diagnosis not present

## 2024-01-03 DIAGNOSIS — Z510 Encounter for antineoplastic radiation therapy: Secondary | ICD-10-CM | POA: Diagnosis not present

## 2024-01-03 DIAGNOSIS — C760 Malignant neoplasm of head, face and neck: Secondary | ICD-10-CM | POA: Diagnosis not present

## 2024-01-04 DIAGNOSIS — Z510 Encounter for antineoplastic radiation therapy: Secondary | ICD-10-CM | POA: Diagnosis not present

## 2024-01-04 DIAGNOSIS — C760 Malignant neoplasm of head, face and neck: Secondary | ICD-10-CM | POA: Diagnosis not present

## 2024-01-06 DIAGNOSIS — C760 Malignant neoplasm of head, face and neck: Secondary | ICD-10-CM | POA: Diagnosis not present

## 2024-01-06 DIAGNOSIS — Z510 Encounter for antineoplastic radiation therapy: Secondary | ICD-10-CM | POA: Diagnosis not present

## 2024-01-07 DIAGNOSIS — Z510 Encounter for antineoplastic radiation therapy: Secondary | ICD-10-CM | POA: Diagnosis not present

## 2024-01-07 DIAGNOSIS — C760 Malignant neoplasm of head, face and neck: Secondary | ICD-10-CM | POA: Diagnosis not present

## 2024-01-08 DIAGNOSIS — Z510 Encounter for antineoplastic radiation therapy: Secondary | ICD-10-CM | POA: Diagnosis not present

## 2024-01-08 DIAGNOSIS — C760 Malignant neoplasm of head, face and neck: Secondary | ICD-10-CM | POA: Diagnosis not present

## 2024-01-09 DIAGNOSIS — C760 Malignant neoplasm of head, face and neck: Secondary | ICD-10-CM | POA: Diagnosis not present

## 2024-01-09 DIAGNOSIS — Z510 Encounter for antineoplastic radiation therapy: Secondary | ICD-10-CM | POA: Diagnosis not present

## 2024-01-14 DIAGNOSIS — C760 Malignant neoplasm of head, face and neck: Secondary | ICD-10-CM | POA: Diagnosis not present

## 2024-01-21 DIAGNOSIS — C760 Malignant neoplasm of head, face and neck: Secondary | ICD-10-CM | POA: Diagnosis not present

## 2024-01-24 DIAGNOSIS — C760 Malignant neoplasm of head, face and neck: Secondary | ICD-10-CM | POA: Diagnosis not present

## 2024-01-31 ENCOUNTER — Other Ambulatory Visit: Payer: Self-pay | Admitting: Internal Medicine

## 2024-01-31 DIAGNOSIS — C760 Malignant neoplasm of head, face and neck: Secondary | ICD-10-CM | POA: Diagnosis not present

## 2024-01-31 DIAGNOSIS — Z510 Encounter for antineoplastic radiation therapy: Secondary | ICD-10-CM | POA: Diagnosis not present

## 2024-01-31 NOTE — Telephone Encounter (Signed)
 Copied from CRM #[id number]. Topic: Clinical - Medication Refill >> Jan 31, 2024  9:34 AM [name] wrote: Medication: sildenafil  (VIAGRA ) 100 MG tablet  Has the patient contacted their pharmacy? Yes (Agent: If no, request that the patient contact the pharmacy for the refill. If patient does not wish to contact the pharmacy document the reason why and proceed with request.) (Agent: If yes, when and what did the pharmacy advise?)  This is the patient's preferred pharmacy:  [id number] [address], [place] - [address] [place] [address] Phone: [phone] Fax: [phone]   Is this the correct pharmacy for this prescription? Yes If no, delete pharmacy and type the correct one.   Has the prescription been filled recently? Yes  Is the patient out of the medication? Yes  Has the patient been seen for an appointment in the last year OR does the patient have an upcoming appointment? No  Can we respond through MyChart? No  Agent: Please be advised that Rx refills may take up to 3 business days. We ask that you follow-up with your pharmacy.

## 2024-07-21 ENCOUNTER — Ambulatory Visit
